# Patient Record
Sex: Male | Born: 1970 | Race: Black or African American | Hispanic: No | Marital: Married | Smoking: Never smoker
Health system: Southern US, Community
[De-identification: ages and names within clinical notes are randomized; demographics above are authoritative.]

## PROBLEM LIST (undated history)

## (undated) DIAGNOSIS — E119 Type 2 diabetes mellitus without complications: Secondary | ICD-10-CM

## (undated) DIAGNOSIS — I10 Essential (primary) hypertension: Secondary | ICD-10-CM

## (undated) DIAGNOSIS — F329 Major depressive disorder, single episode, unspecified: Secondary | ICD-10-CM

## (undated) DIAGNOSIS — M1990 Unspecified osteoarthritis, unspecified site: Secondary | ICD-10-CM

## (undated) DIAGNOSIS — A159 Respiratory tuberculosis unspecified: Secondary | ICD-10-CM

## (undated) DIAGNOSIS — F32A Depression, unspecified: Secondary | ICD-10-CM

## (undated) HISTORY — PX: HERNIA REPAIR: SHX51

## (undated) HISTORY — DX: Major depressive disorder, single episode, unspecified: F32.9

## (undated) HISTORY — DX: Depression, unspecified: F32.A

## (undated) HISTORY — DX: Unspecified osteoarthritis, unspecified site: M19.90

---

## 2013-05-30 ENCOUNTER — Emergency Department (HOSPITAL_COMMUNITY): Payer: Self-pay

## 2013-05-30 ENCOUNTER — Encounter (HOSPITAL_COMMUNITY): Payer: Self-pay | Admitting: *Deleted

## 2013-05-30 ENCOUNTER — Observation Stay (HOSPITAL_COMMUNITY)
Admission: EM | Admit: 2013-05-30 | Discharge: 2013-06-02 | Disposition: A | Discharge status: Home / Self Care | Payer: MEDICAID | Attending: Family Medicine | Admitting: Family Medicine

## 2013-05-30 DIAGNOSIS — M1990 Unspecified osteoarthritis, unspecified site: Secondary | ICD-10-CM | POA: Insufficient documentation

## 2013-05-30 DIAGNOSIS — E119 Type 2 diabetes mellitus without complications: Secondary | ICD-10-CM | POA: Diagnosis present

## 2013-05-30 DIAGNOSIS — R0789 Other chest pain: Principal | ICD-10-CM | POA: Insufficient documentation

## 2013-05-30 DIAGNOSIS — R0609 Other forms of dyspnea: Secondary | ICD-10-CM

## 2013-05-30 DIAGNOSIS — Z79899 Other long term (current) drug therapy: Secondary | ICD-10-CM | POA: Insufficient documentation

## 2013-05-30 DIAGNOSIS — F319 Bipolar disorder, unspecified: Secondary | ICD-10-CM | POA: Insufficient documentation

## 2013-05-30 DIAGNOSIS — R079 Chest pain, unspecified: Secondary | ICD-10-CM

## 2013-05-30 DIAGNOSIS — F3289 Other specified depressive episodes: Secondary | ICD-10-CM | POA: Insufficient documentation

## 2013-05-30 DIAGNOSIS — I10 Essential (primary) hypertension: Secondary | ICD-10-CM | POA: Insufficient documentation

## 2013-05-30 DIAGNOSIS — R0600 Dyspnea, unspecified: Secondary | ICD-10-CM

## 2013-05-30 DIAGNOSIS — F329 Major depressive disorder, single episode, unspecified: Secondary | ICD-10-CM | POA: Insufficient documentation

## 2013-05-30 HISTORY — DX: Respiratory tuberculosis unspecified: A15.9

## 2013-05-30 HISTORY — DX: Type 2 diabetes mellitus without complications: E11.9

## 2013-05-30 HISTORY — DX: Essential (primary) hypertension: I10

## 2013-05-30 LAB — COMPREHENSIVE METABOLIC PANEL
AST: 31 U/L (ref 0–37)
Alkaline Phosphatase: 102 U/L (ref 39–117)
BUN: 10 mg/dL (ref 6–23)
CO2: 24 mEq/L (ref 19–32)
Chloride: 102 mEq/L (ref 96–112)
Creatinine, Ser: 1.17 mg/dL (ref 0.50–1.35)
GFR calc non Af Amer: 74 mL/min — ABNORMAL LOW (ref >90)
Total Bilirubin: 0.4 mg/dL (ref 0.3–1.2)

## 2013-05-30 LAB — CBC WITH DIFFERENTIAL/PLATELET
Eosinophils Relative: 2 % (ref 0–5)
HCT: 34.4 % — ABNORMAL LOW (ref 39.0–52.0)
Hemoglobin: 10.9 g/dL — ABNORMAL LOW (ref 13.0–17.0)
Lymphocytes Relative: 23 % (ref 12–46)
Lymphs Abs: 2.1 10*3/uL (ref 0.7–4.0)
MCV: 57.9 fL — ABNORMAL LOW (ref 78.0–100.0)
Monocytes Absolute: 0.4 10*3/uL (ref 0.1–1.0)
RBC: 5.94 MIL/uL — ABNORMAL HIGH (ref 4.22–5.81)
WBC: 9.2 10*3/uL (ref 4.0–10.5)

## 2013-05-30 LAB — POCT I-STAT TROPONIN I
Troponin i, poc: 0.01 ng/mL (ref 0.00–0.08)
Troponin i, poc: 0.01 ng/mL (ref 0.00–0.08)

## 2013-05-30 MED ORDER — NITROGLYCERIN 0.4 MG SL SUBL
0.4000 mg | SUBLINGUAL_TABLET | SUBLINGUAL | Status: AC | PRN
  Administered 2013-05-30 (×3): 0.4 mg via SUBLINGUAL
  Filled 2013-05-30: qty 25

## 2013-05-30 MED ORDER — ACETAMINOPHEN 325 MG PO TABS
ORAL_TABLET | ORAL | Status: AC
  Administered 2013-05-30: 650 mg via ORAL
  Filled 2013-05-30: qty 2

## 2013-05-30 MED ORDER — ONDANSETRON HCL 4 MG/2ML IJ SOLN: 4.0000 mg | Freq: Four times a day (QID) | INTRAMUSCULAR | Status: DC | PRN

## 2013-05-30 MED ORDER — HEPARIN SODIUM (PORCINE) 5000 UNIT/ML IJ SOLN
5000.0000 [IU] | Freq: Three times a day (TID) | INTRAMUSCULAR | Status: DC
  Administered 2013-05-31 – 2013-06-02 (×8): 5000 [IU] via SUBCUTANEOUS
  Filled 2013-05-30 (×11): qty 1

## 2013-05-30 MED ORDER — DICLOFENAC SODIUM 75 MG PO TBEC
75.0000 mg | DELAYED_RELEASE_TABLET | Freq: Two times a day (BID) | ORAL | Status: DC | PRN
  Filled 2013-05-30: qty 1

## 2013-05-30 MED ORDER — INSULIN ASPART 100 UNIT/ML ~~LOC~~ SOLN
0.0000 [IU] | SUBCUTANEOUS | Status: DC
  Administered 2013-05-31: 3 [IU] via SUBCUTANEOUS
  Administered 2013-05-31 (×2): 2 [IU] via SUBCUTANEOUS

## 2013-05-30 MED ORDER — ASPIRIN 81 MG PO CHEW
324.0000 mg | CHEWABLE_TABLET | Freq: Once | ORAL | Status: AC
  Administered 2013-05-30: 324 mg via ORAL
  Filled 2013-05-30: qty 4

## 2013-05-30 MED ORDER — VITAMIN D3 25 MCG (1000 UNIT) PO TABS
1000.0000 [IU] | ORAL_TABLET | Freq: Two times a day (BID) | ORAL | Status: DC
  Administered 2013-05-31 – 2013-06-02 (×6): 1000 [IU] via ORAL
  Filled 2013-05-30 (×7): qty 1

## 2013-05-30 MED ORDER — ACETAMINOPHEN 325 MG PO TABS
650.0000 mg | ORAL_TABLET | ORAL | Status: DC | PRN
  Administered 2013-06-01: 650 mg via ORAL
  Filled 2013-05-30: qty 2

## 2013-05-30 MED ORDER — ACETAMINOPHEN 325 MG PO TABS: 650.0000 mg | ORAL_TABLET | Freq: Once | ORAL | Status: AC

## 2013-05-30 MED ORDER — RISPERIDONE 2 MG PO TABS
4.0000 mg | ORAL_TABLET | Freq: Every day | ORAL | Status: DC
  Administered 2013-05-31 – 2013-06-01 (×3): 4 mg via ORAL
  Filled 2013-05-30 (×4): qty 2

## 2013-05-30 MED ORDER — LISINOPRIL 20 MG PO TABS
20.0000 mg | ORAL_TABLET | Freq: Every day | ORAL | Status: DC
  Administered 2013-05-31 – 2013-06-02 (×3): 20 mg via ORAL
  Filled 2013-05-30 (×3): qty 1

## 2013-05-30 MED ORDER — LITHIUM CARBONATE 300 MG PO CAPS
300.0000 mg | ORAL_CAPSULE | Freq: Two times a day (BID) | ORAL | Status: DC
  Administered 2013-05-31 – 2013-06-02 (×5): 300 mg via ORAL
  Filled 2013-05-30 (×8): qty 1

## 2013-05-30 MED ORDER — TOPIRAMATE 25 MG PO TABS
50.0000 mg | ORAL_TABLET | Freq: Two times a day (BID) | ORAL | Status: DC
  Administered 2013-05-31 – 2013-06-02 (×6): 50 mg via ORAL
  Filled 2013-05-30 (×7): qty 2

## 2013-05-30 MED ORDER — MECLIZINE HCL 25 MG PO TABS
25.0000 mg | ORAL_TABLET | Freq: Two times a day (BID) | ORAL | Status: DC | PRN
  Filled 2013-05-30: qty 1

## 2013-05-30 NOTE — ED Notes (Signed)
Pt stated started having chest pain around 1730; has remained constant; feels light headed and dizzy; tightness

## 2013-05-30 NOTE — ED Notes (Signed)
Bed: WA21 Expected date:  Expected time:  Means of arrival:  Comments: Hall g

## 2013-05-30 NOTE — H&P (Signed)
[facility] History and Physical  Steve Pierce UJW:[id number] DOB: 1971/03/22 DOA: 05/30/2013  Referring physician: ED PCP: No primary provider on file.   Chief Complaint: Chest pain  HPI: Steve Pierce is a 42 y.o. male who presents with sudden onset L sided chest pain that occurred when coming home today.  Crushing L sided chest pain was associated with SOB, no N/V, no radiation.  No cardiac history but does have HTN and poorly controlled DM with elevated A1C (he thinks it was 11 last time).  Similar symptoms in [place] many years ago resulted in stress test.  No h/o PE, DVT, nor prolonged immobilization, has had BLE swelling for which he takes lasix but no calf tenderness.  HEART score = 4: admitting for chest pain rule out.  Review of Systems: 12 systems reviewed and otherwise negative.  Past Medical History  Diagnosis Date  . Hypertension   . Diabetes mellitus without complication    Past Surgical History  Procedure Laterality Date  . Hernia repair     Social History:  reports that he has never smoked. He does not have any smokeless tobacco history on file. He reports that he does not drink alcohol. His drug history is not on file.   No Known Allergies  Family History  Problem Relation Age of Onset  . CAD Neg Hx     Prior to Admission medications   Medication Sig Start Date End Date Taking? Authorizing Provider  cholecalciferol (VITAMIN D) 1000 UNITS tablet Take 1,000 Units by mouth 2 (two) times daily.   Yes Historical Provider, MD  diclofenac (VOLTAREN) 75 MG EC tablet Take 75 mg by mouth 2 (two) times daily as needed (for pain).   Yes Historical Provider, MD  furosemide (LASIX) 40 MG tablet Take 40 mg by mouth daily.   Yes Historical Provider, MD  glipiZIDE (GLUCOTROL) 5 MG tablet Take 5 mg by mouth every morning.   Yes Historical Provider, MD  lisinopril (PRINIVIL,ZESTRIL) 20 MG tablet Take 20 mg by mouth daily.   Yes Historical Provider, MD  lithium  carbonate 300 MG capsule Take 300 mg by mouth 2 (two) times daily with a meal.   Yes Historical Provider, MD  meclizine (ANTIVERT) 25 MG tablet Take 25 mg by mouth 2 (two) times daily as needed for dizziness.   Yes Historical Provider, MD  risperidone (RISPERDAL) 4 MG tablet Take 4 mg by mouth at bedtime.   Yes Historical Provider, MD  sitaGLIPtan-metformin (JANUMET) 50-500 MG per tablet Take 1 tablet by mouth every morning.   Yes Historical Provider, MD  topiramate (TOPAMAX) 25 MG tablet Take 50 mg by mouth 2 (two) times daily.   Yes Historical Provider, MD   Physical Exam: Filed Vitals:   05/30/13 2019  BP: 131/80  Pulse: 104  Temp: 99.1 F (37.3 C)    General:  NAD, resting comfortably in bed Eyes: PEERLA EOMI ENT: mucous membranes moist Neck: supple w/o JVD Cardiovascular: RRR w/o MRG Respiratory: CTA B Abdomen: soft, nt, nd, bs+ Skin: no rash nor lesion Musculoskeletal: MAE, full ROM all 4 extremities Psychiatric: normal tone and affect Neurologic: AAOx3, grossly non-focal  Labs on Admission:  Basic Metabolic Panel:  Recent Labs Lab 05/30/13 2029  NA 136  K 3.8  CL 102  CO2 24  GLUCOSE 133*  BUN 10  CREATININE 1.17  CALCIUM 9.3   Liver Function Tests:  Recent Labs Lab 05/30/13 2029  AST 31  ALT 45  ALKPHOS 102  BILITOT 0.4  PROT 7.3  ALBUMIN 3.8   No results found for this basename: LIPASE, AMYLASE,  in the last 168 hours No results found for this basename: AMMONIA,  in the last 168 hours CBC:  Recent Labs Lab 05/30/13 2029  WBC 9.2  NEUTROABS 6.5  HGB 10.9*  HCT 34.4*  MCV 57.9*  PLT 292   Cardiac Enzymes: No results found for this basename: CKTOTAL, CKMB, CKMBINDEX, TROPONINI,  in the last 168 hours  BNP (last 3 results)  Recent Labs  05/30/13 2029  PROBNP 15.6   CBG: No results found for this basename: GLUCAP,  in the last 168 hours  Radiological Exams on Admission: Dg Chest 2 View  05/30/2013   *RADIOLOGY REPORT*  Clinical  Data: Chest pain and shortness of breath.  CHEST - 2 VIEW  Comparison: None.  Findings: Heart size is normal.  There is slight vascular prominence but the patient has taken a very shallow inspiration. No consolidative infiltrates or effusions.  No osseous abnormalities.  IMPRESSION: No acute abnormality.   Original Report Authenticated By: Francene Boyers, Pierce.D.    EKG: Independently reviewed.  Assessment/Plan Principal Problem:   Chest pain Active Problems:   DM2 (diabetes mellitus, type 2)   HTN (hypertension)   1. Chest pain - given the very poorly controlled DM2 which is a disease risk equivilent for CAD, and the fact that he is less than 1 year of age from getting a point for age, do feel that Despite a formal heart score of 3, his heart score could be argued to be almost as high as 5.  Do feel that he deserves a formal cardiology consultation and likely will need stress test for rule out.  Have also ordered 2D echo, but despite the mild tachycardia of 104 and temperature of 99.1 given no risk factors, no history of prolonged immobility, no calf pain, and no unilateral swelling, feel that PE is less likely. 2. DM2 - holding home meds and putting him on med dose SSI q4h while he is NPO. 3. HTN - continue home meds other than lasix which is being held (dont want him getting dehydrated while NPO).    Code Status: Full Code (must indicate code status--if unknown or must be presumed, indicate so) Family Communication: Spoke with family at bedside (indicate person spoken with, if applicable, with phone number if by telephone) Disposition Plan: Admit to obs (indicate anticipated LOS)  Time spent: 50 min  Steve Pierce, Steve Pierce. [facility] Pager [phone]  If 7PM-7AM, please contact night-coverage [email or web address] Password [facility] [place] 05/30/2013, 10:44 PM

## 2013-05-30 NOTE — ED Provider Notes (Signed)
TIME SEEN: 8:44 PM   CHIEF COMPLAINT: Chest pain  HPI: Patient is a 42 year old male with history of hypertension, diabetes presents emergency department with sudden onset left-sided chest tightness without radiation at approximately 5:30 tonight. He states his symptoms occurred at rest and cause him to feel short of breath, lightheaded and diaphoretic. No nausea or vomiting. He's had similar symptoms many years ago which resulted in a stress test and [place] which he reports was negative. No history of MI or cardiac catheterization. No prior history of PE or DVT. He states he has had bilateral lower extremity swelling but no calf tenderness. No recent prolonged immobilization, fracture, surgery, trauma. He denies a history of hyperlipidemia or tobacco use.  ROS: See HPI Constitutional: no fever  Eyes: no drainage  ENT: no runny nose   Cardiovascular:  chest pain  Resp: SOB  GI: no vomiting GU: no dysuria Integumentary: no rash  Allergy: no hives  Musculoskeletal: leg swelling  Neurological: no slurred speech ROS otherwise negative  PAST MEDICAL HISTORY/PAST SURGICAL HISTORY:  Past Medical History  Diagnosis Date  . Hypertension   . Diabetes mellitus without complication     MEDICATIONS:  Prior to Admission medications   Medication Sig Start Date End Date Taking? Authorizing Provider  cholecalciferol (VITAMIN D) 1000 UNITS tablet Take 1,000 Units by mouth 2 (two) times daily.   Yes Historical Provider, MD  diclofenac (VOLTAREN) 75 MG EC tablet Take 75 mg by mouth 2 (two) times daily as needed (for pain).   Yes Historical Provider, MD  furosemide (LASIX) 40 MG tablet Take 40 mg by mouth daily.   Yes Historical Provider, MD  glipiZIDE (GLUCOTROL) 5 MG tablet Take 5 mg by mouth every morning.   Yes Historical Provider, MD  lisinopril (PRINIVIL,ZESTRIL) 20 MG tablet Take 20 mg by mouth daily.   Yes Historical Provider, MD  lithium carbonate 300 MG capsule Take 300 mg by mouth 2  (two) times daily with a meal.   Yes Historical Provider, MD  meclizine (ANTIVERT) 25 MG tablet Take 25 mg by mouth 2 (two) times daily as needed for dizziness.   Yes Historical Provider, MD  risperidone (RISPERDAL) 4 MG tablet Take 4 mg by mouth at bedtime.   Yes Historical Provider, MD  sitaGLIPtan-metformin (JANUMET) 50-500 MG per tablet Take 1 tablet by mouth every morning.   Yes Historical Provider, MD  topiramate (TOPAMAX) 25 MG tablet Take 50 mg by mouth 2 (two) times daily.   Yes Historical Provider, MD    ALLERGIES:  No Known Allergies  SOCIAL HISTORY:  History  Substance Use Topics  . Smoking status: Never Smoker   . Smokeless tobacco: Not on file  . Alcohol Use: No    FAMILY HISTORY: Grandfather with a history of coronary artery disease  EXAM: BP 131/80  Pulse 104  Temp(Src) 99.1 F (37.3 C)  SpO2 97% CONSTITUTIONAL: Alert and oriented and responds appropriately to questions. Well-appearing; well-nourished HEAD: Normocephalic EYES: Conjunctivae clear, PERRL ENT: normal nose; no rhinorrhea; moist mucous membranes; pharynx without lesions noted NECK: Supple, no meningismus, no LAD  CARD: Tachycardic; S1 and S2 appreciated; no murmurs, no clicks, no rubs, no gallops RESP: Normal chest excursion without splinting or tachypnea; breath sounds clear and equal bilaterally; no wheezes, no rhonchi, no rales,  ABD/GI: Normal bowel sounds; non-distended; soft, non-tender, no rebound, no guarding BACK:  The back appears normal and is non-tender to palpation, there is no CVA tenderness EXT: Normal ROM in all joints; non-tender  to palpation; no edema; normal capillary refill; no cyanosis    SKIN: Normal color for age and race; warm NEURO: Moves all extremities equally PSYCH: The patient's mood and manner are appropriate. Grooming and personal hygiene are appropriate.  MEDICAL DECISION MAKING: Patient was concerning story for cardiac chest pain. We'll obtain labs, EKG, chest  x-ray. Anticipate admission to the hospital for chest pain rule out.  ED PROGRESS:    Date: 05/30/2013 20:26  Rate: 102  Rhythm: Sinus tachycardia  QRS Axis: normal  Intervals: normal  ST/T Wave abnormalities: normal  Conduction Disutrbances: none  Narrative Interpretation: unremarkable; no ischemic changes, no old for comparison   9:32 PM  CXR clear.  First troponin negative. Will discuss with internal medicine for admission. PCP is Dartha Lodge with [place].  10:25 PM  Spoke with hospitalist for admission for chest pain rule out.  Layla Maw Murphy Duzan, DO 05/30/13 2225

## 2013-05-31 ENCOUNTER — Encounter (HOSPITAL_COMMUNITY): Payer: Self-pay

## 2013-05-31 LAB — GLUCOSE, CAPILLARY
Glucose-Capillary: 136 mg/dL — ABNORMAL HIGH (ref 70–99)
Glucose-Capillary: 166 mg/dL — ABNORMAL HIGH (ref 70–99)
Glucose-Capillary: 150 mg/dL — ABNORMAL HIGH (ref 70–99)

## 2013-05-31 LAB — HEMOGLOBIN A1C
Hgb A1c MFr Bld: 7.3 % — ABNORMAL HIGH (ref <5.7)
Mean Plasma Glucose: 163 mg/dL — ABNORMAL HIGH (ref <117)

## 2013-05-31 MED ORDER — INSULIN ASPART 100 UNIT/ML ~~LOC~~ SOLN
3.0000 [IU] | Freq: Three times a day (TID) | SUBCUTANEOUS | Status: DC
  Administered 2013-05-31 – 2013-06-01 (×4): 3 [IU] via SUBCUTANEOUS

## 2013-05-31 MED ORDER — SODIUM CHLORIDE 0.9 % IJ SOLN
3.0000 mL | Freq: Two times a day (BID) | INTRAMUSCULAR | Status: DC
  Administered 2013-05-31 – 2013-06-02 (×4): 3 mL via INTRAVENOUS

## 2013-05-31 MED ORDER — INSULIN ASPART 100 UNIT/ML ~~LOC~~ SOLN
0.0000 [IU] | Freq: Three times a day (TID) | SUBCUTANEOUS | Status: DC
  Administered 2013-05-31: 1 [IU] via SUBCUTANEOUS
  Administered 2013-05-31 – 2013-06-01 (×2): 2 [IU] via SUBCUTANEOUS
  Administered 2013-06-01: 3 [IU] via SUBCUTANEOUS
  Administered 2013-06-02 (×2): 2 [IU] via SUBCUTANEOUS

## 2013-05-31 NOTE — Consult Note (Signed)
Reason for Consult chest pain Referring Physician: [facility]  Steve Pierce is an 42 y.o. male.  HPI: Patient is 42 year old male with past medical history significant for hypertension, hypercholesteremia, morbid obesity, depression, degenerative joint disease, was admitted because of precordial chest pain described as tightness associated with lightheadedness dizziness and diaphoresis grade 9/10 now after walking. Patient also gives history of exertional dyspnea by walking uphill. Denies any syncopal episode. Denies history of PND orthopnea his history of leg swelling. States had stress test approximately 15 years ago in [place] which was okay. Denies any recent cardiac workup. Patient received 3 sublingual nitroglycerin with relief of chest pain. Patient states also chest pain increases with deep breathing and sitting forward. Denies any fever chills or flulike symptoms.  Past Medical History  Diagnosis Date  . Hypertension   . Diabetes mellitus without complication     Past Surgical History  Procedure Laterality Date  . Hernia repair      Family History  Problem Relation Age of Onset  . CAD Neg Hx     Social History:  reports that he has never smoked. He does not have any smokeless tobacco history on file. He reports that he does not drink alcohol. His drug history is not on file.  Allergies: No Known Allergies  Medications: I have reviewed the patient's current medications.  Results for orders placed during the hospital encounter of 05/30/13 (from the past 48 hour(s))  CBC WITH DIFFERENTIAL     Status: Abnormal   Collection Time    05/30/13  8:29 PM      Result Value Range   WBC 9.2  4.0 - 10.5 K/uL   RBC 5.94 (*) 4.22 - 5.81 MIL/uL   Hemoglobin 10.9 (*) 13.0 - 17.0 g/dL   HCT 11.9 (*) 14.7 - 82.9 %   MCV 57.9 (*) 78.0 - 100.0 fL   MCH 18.4 (*) 26.0 - 34.0 pg   MCHC 31.7  30.0 - 36.0 g/dL   RDW 56.2 (*) 13.0 - 86.5 %   Platelets 292  150 - 400 K/uL   Neutrophils Relative % 71  43 - 77 %   Neutro Abs 6.5  1.7 - 7.7 K/uL   Lymphocytes Relative 23  12 - 46 %   Lymphs Abs 2.1  0.7 - 4.0 K/uL   Monocytes Relative 5  3 - 12 %   Monocytes Absolute 0.4  0.1 - 1.0 K/uL   Eosinophils Relative 2  0 - 5 %   Eosinophils Absolute 0.2  0.0 - 0.7 K/uL   Basophils Relative 0  0 - 1 %   Basophils Absolute 0.0  0.0 - 0.1 K/uL   Smear Review MORPHOLOGY UNREMARKABLE    COMPREHENSIVE METABOLIC PANEL     Status: Abnormal   Collection Time    05/30/13  8:29 PM      Result Value Range   Sodium 136  135 - 145 mEq/L   Potassium 3.8  3.5 - 5.1 mEq/L   Chloride 102  96 - 112 mEq/L   CO2 24  19 - 32 mEq/L   Glucose, Bld 133 (*) 70 - 99 mg/dL   BUN 10  6 - 23 mg/dL   Creatinine, Ser 7.84  0.50 - 1.35 mg/dL   Calcium 9.3  8.4 - 69.6 mg/dL   Total Protein 7.3  6.0 - 8.3 g/dL   Albumin 3.8  3.5 - 5.2 g/dL   AST 31  0 - 37 U/L   ALT  45  0 - 53 U/L   Alkaline Phosphatase 102  39 - 117 U/L   Total Bilirubin 0.4  0.3 - 1.2 mg/dL   GFR calc non Af Amer 74 (*) >90 mL/min   GFR calc Af Amer 86 (*) >90 mL/min   Comment: (NOTE)     The eGFR has been calculated using the CKD EPI equation.     This calculation has not been validated in all clinical situations.     eGFR's persistently <90 mL/min signify possible Chronic Kidney     Disease.  PRO B NATRIURETIC PEPTIDE     Status: None   Collection Time    05/30/13  8:29 PM      Result Value Range   Pro B Natriuretic peptide (BNP) 15.6  0 - 125 pg/mL  HEMOGLOBIN A1C     Status: Abnormal   Collection Time    05/30/13  8:29 PM      Result Value Range   Hemoglobin A1C 7.3 (*) <5.7 %   Comment: (NOTE)                                                                               According to the ADA Clinical Practice Recommendations for 2011, when     HbA1c is used as a screening test:      >=6.5%   Diagnostic of Diabetes Mellitus               (if abnormal result is confirmed)     5.7-6.4%   Increased risk of  developing Diabetes Mellitus     References:Diagnosis and Classification of Diabetes Mellitus,Diabetes     Care,2011,34(Suppl 1):S62-S69 and Standards of Medical Care in             Diabetes - 2011,Diabetes Care,2011,34 (Suppl 1):S11-S61.   Mean Plasma Glucose 163 (*) <117 mg/dL   Comment: Performed at [facility]  POCT I-STAT TROPONIN I     Status: None   Collection Time    05/30/13  8:40 PM      Result Value Range   Troponin i, poc 0.01  0.00 - 0.08 ng/mL   Comment 3            Comment: Due to the release kinetics of cTnI,     a negative result within the first hours     of the onset of symptoms does not rule out     myocardial infarction with certainty.     If myocardial infarction is still suspected,     repeat the test at appropriate intervals.  POCT I-STAT TROPONIN I     Status: None   Collection Time    05/30/13 11:05 PM      Result Value Range   Troponin i, poc 0.01  0.00 - 0.08 ng/mL   Comment 3            Comment: Due to the release kinetics of cTnI,     a negative result within the first hours     of the onset of symptoms does not rule out     myocardial infarction with certainty.     If myocardial infarction is still  suspected,     repeat the test at appropriate intervals.  GLUCOSE, CAPILLARY     Status: Abnormal   Collection Time    05/30/13 11:35 PM      Result Value Range   Glucose-Capillary 146 (*) 70 - 99 mg/dL  GLUCOSE, CAPILLARY     Status: Abnormal   Collection Time    05/31/13  4:11 AM      Result Value Range   Glucose-Capillary 136 (*) 70 - 99 mg/dL  TROPONIN I     Status: None   Collection Time    05/31/13  6:00 AM      Result Value Range   Troponin I <0.30  <0.30 ng/mL   Comment:            Due to the release kinetics of cTnI,     a negative result within the first hours     of the onset of symptoms does not rule out     myocardial infarction with certainty.     If myocardial infarction is still suspected,     repeat the test at  appropriate intervals.  GLUCOSE, CAPILLARY     Status: Abnormal   Collection Time    05/31/13  7:49 AM      Result Value Range   Glucose-Capillary 154 (*) 70 - 99 mg/dL   Comment 1 Notify RN     Comment 2 Documented in Chart      Dg Chest 2 View  05/30/2013   *RADIOLOGY REPORT*  Clinical Data: Chest pain and shortness of breath.  CHEST - 2 VIEW  Comparison: None.  Findings: Heart size is normal.  There is slight vascular prominence but the patient has taken a very shallow inspiration. No consolidative infiltrates or effusions.  No osseous abnormalities.  IMPRESSION: No acute abnormality.   Original Report Authenticated By: Francene Boyers, M.D.    Review of Systems  Constitutional: Negative for fever, chills and weight loss.  Eyes: Negative for blurred vision and double vision.  Respiratory: Negative for cough, hemoptysis and sputum production.   Cardiovascular: Positive for chest pain, palpitations and leg swelling. Negative for orthopnea, claudication and PND.  Gastrointestinal: Negative for nausea, vomiting and abdominal pain.  Neurological: Positive for dizziness.   Blood pressure 119/69, pulse 83, temperature 98.1 F (36.7 C), temperature source Oral, resp. rate 14, height 5\' 11"  (1.803 m), weight 135.8 kg (299 lb 6.2 oz), SpO2 96.00%. Physical Exam  Constitutional: He is oriented to person, place, and time. No distress.  HENT:  Head: Normocephalic and atraumatic.  Eyes: Conjunctivae are normal. Pupils are equal, round, and reactive to light. Left eye exhibits no discharge.  Neck: Normal range of motion. Neck supple. No JVD present. No tracheal deviation present. No thyromegaly present.  Cardiovascular: Normal rate, regular rhythm and intact distal pulses.  Exam reveals no friction rub.   No murmur heard. Respiratory: Effort normal and breath sounds normal. No respiratory distress. He has no wheezes. He has no rales.  GI: Soft. Bowel sounds are normal. He exhibits no distension.  There is no tenderness. There is no rebound.  Musculoskeletal: He exhibits no tenderness.  Neurological: He is alert and oriented to person, place, and time.  Skin: He is not diaphoretic.    Assessment/Plan: Status post atypical chest pain with some features worrisome for angina MI ruled out Hypertension Diabetes mellitus Morbid obesity Depression Degenerative joint disease Plan Agree with present management Will schedule for nuclear stress test tomorrow Check lipid panel/hemoglobin  A1c Discussed with patient regarding lifestyle modification diet exercise  Billie Intriago N 05/31/2013, 11:49 AM

## 2013-05-31 NOTE — Care Management Note (Addendum)
    Page 1 of 1   06/02/2013     12:13:50 PM   CARE MANAGEMENT NOTE 06/02/2013  Patient:  [facility], LIPTON   Account Number:  [id number]  Date Initiated:  05/31/2013  Documentation initiated by:  Lanier Clam  Subjective/Objective Assessment:   ADMITTED W/CHEST PAIN.HX:HTN,DM.     Action/Plan:   FROM HOME.   Anticipated DC Date:  06/02/2013   Anticipated DC Plan:  HOME/SELF CARE      DC Planning Services  CM consult      Choice offered to / List presented to:             Status of service:  Completed, signed off Medicare Important Message given?   (If response is "NO", the following Medicare IM given date fields will be blank) Date Medicare IM given:   Date Additional Medicare IM given:    Discharge Disposition:  HOME/SELF CARE  Per UR Regulation:  Reviewed for med. necessity/level of care/duration of stay  If discussed at Long Length of Stay Meetings, dates discussed:    Comments:  06/02/13 Daxon Kyne RN,BSN NCM [phone] D/C HOME NO NEEDS OR ORDERS.  06/01/13 Consuela Widener RN,BSN NCM [phone] STRESS TEST TODAY.D/C PLAN HOME.  05/31/13 Jenya Putz RN,BSN NCM [phone]

## 2013-05-31 NOTE — Progress Notes (Signed)
  Echocardiogram 2D Echocardiogram has been performed.  Steve Pierce 05/31/2013, 11:29 AM

## 2013-05-31 NOTE — Progress Notes (Signed)
8:13 AM I agree with HPI/GPe and A/P per Dr. Julian Reil  42 y/o AAM, no known heart disease but reported neg Stress test in the past presented to [facility] 8/20 with CP after walking less than a mile.  This is unaccustomed exertion for him and he is usually sedentary.  Quality of pain was gripping and central chest, with diaphoresis, no n/v/f/chills.  No radiation.  Sitting up seemed to make it worse    Had LE swelling about 8/16 which is usual for him and has been on lasix in the past but has never formally been told he has CHF Was given nitro in ED which releived pain Has had this pain before but thought it was gas  HEENT Morbid obesity, Body mass index is 41.77 kg/(m^2). CHEST cta b CARDIAC s1 s2 no m/r/g ABDOMEN soft NT/ND  Patient Active Problem List   Diagnosis Date Noted  . DM2 (diabetes mellitus, type 2) 05/30/2013  . Chest pain 05/30/2013  . HTN (hypertension) 05/30/2013   A/p?Cardigenic CP-spoke with Dr. Sharyn Lull, Cardiology who agrees to see him in consult.  Will grad diet and monitor CBG's qid ac Hold Janumet in hospital, Glipizide., Lasix for now Get CMET and CBC am and ECHO pending   Pleas Koch, MD [facility] [phone]

## 2013-06-01 ENCOUNTER — Other Ambulatory Visit: Payer: Self-pay

## 2013-06-01 ENCOUNTER — Ambulatory Visit (HOSPITAL_COMMUNITY)
Admit: 2013-06-01 | Discharge: 2013-06-01 | Disposition: A | Discharge status: Home / Self Care | Payer: Self-pay | Attending: Cardiology | Admitting: Cardiology

## 2013-06-01 DIAGNOSIS — I10 Essential (primary) hypertension: Secondary | ICD-10-CM | POA: Insufficient documentation

## 2013-06-01 DIAGNOSIS — R079 Chest pain, unspecified: Secondary | ICD-10-CM | POA: Insufficient documentation

## 2013-06-01 DIAGNOSIS — E119 Type 2 diabetes mellitus without complications: Secondary | ICD-10-CM | POA: Insufficient documentation

## 2013-06-01 LAB — GLUCOSE, CAPILLARY
Glucose-Capillary: 159 mg/dL — ABNORMAL HIGH (ref 70–99)
Glucose-Capillary: 201 mg/dL — ABNORMAL HIGH (ref 70–99)

## 2013-06-01 LAB — COMPREHENSIVE METABOLIC PANEL
Albumin: 3.5 g/dL (ref 3.5–5.2)
BUN: 13 mg/dL (ref 6–23)
Chloride: 104 mEq/L (ref 96–112)
Creatinine, Ser: 1.11 mg/dL (ref 0.50–1.35)
Total Bilirubin: 0.4 mg/dL (ref 0.3–1.2)
Total Protein: 6.8 g/dL (ref 6.0–8.3)

## 2013-06-01 LAB — CBC
MCV: 58.4 fL — ABNORMAL LOW (ref 78.0–100.0)
Platelets: 255 10*3/uL (ref 150–400)
RBC: 5.77 MIL/uL (ref 4.22–5.81)
WBC: 6.5 10*3/uL (ref 4.0–10.5)

## 2013-06-01 MED ORDER — REGADENOSON 0.4 MG/5ML IV SOLN
INTRAVENOUS | Status: AC
  Filled 2013-06-01: qty 5

## 2013-06-01 MED ORDER — REGADENOSON 0.4 MG/5ML IV SOLN
0.4000 mg | Freq: Once | INTRAVENOUS | Status: AC
  Administered 2013-06-01: 0.4 mg via INTRAVENOUS

## 2013-06-01 MED ORDER — TECHNETIUM TC 99M SESTAMIBI GENERIC - CARDIOLITE
30.0000 | Freq: Once | INTRAVENOUS | Status: AC | PRN
  Administered 2013-06-01: 30 via INTRAVENOUS

## 2013-06-01 NOTE — Progress Notes (Signed)
Subjective:  Patient denies any further chest pains or shortness of breath. Schedule for nuclear stress test today two protocol because of morbid obesity .  Objective:  Vital Signs in the last 24 hours: Temp:  [98 F (36.7 C)-98.5 F (36.9 C)] 98.5 F (36.9 C) (08/21 0514) Pulse Rate:  [80-93] 93 (08/21 0514) Resp:  [14-20] 18 (08/21 0514) BP: (113-128)/(63-78) 120/78 mmHg (08/21 0514) SpO2:  [95 %-100 %] 95 % (08/21 0514)  Intake/Output from previous day: 08/20 0701 - 08/21 0700 In: 1443 [P.O.:1440; I.V.:3] Out: -  Intake/Output from this shift: Total I/O In: 3 [I.V.:3] Out: -   Physical Exam: Neck: no adenopathy, no carotid bruit, no JVD and supple, symmetrical, trachea midline Lungs: clear to auscultation bilaterally Heart: regular rate and rhythm, S1, S2 normal, no murmur, click, rub or gallop Abdomen: soft, non-tender; bowel sounds normal; no masses,  no organomegaly Extremities: extremities normal, atraumatic, no cyanosis or edema  Lab Results:  Recent Labs  05/30/13 2029 06/01/13 0501  WBC 9.2 6.5  HGB 10.9* 10.7*  PLT 292 255    Recent Labs  05/30/13 2029 06/01/13 0501  NA 136 138  K 3.8 4.4  CL 102 104  CO2 24 26  GLUCOSE 133* 160*  BUN 10 13  CREATININE 1.17 1.11    Recent Labs  05/31/13 0600  TROPONINI <0.30   Hepatic Function Panel  Recent Labs  06/01/13 0501  PROT 6.8  ALBUMIN 3.5  AST 21  ALT 35  ALKPHOS 89  BILITOT 0.4    Recent Labs  05/31/13 0600  CHOL 149   No results found for this basename: PROTIME,  in the last 72 hours  Imaging: Imaging results have been reviewed and Dg Chest 2 View  05/30/2013   *RADIOLOGY REPORT*  Clinical Data: Chest pain and shortness of breath.  CHEST - 2 VIEW  Comparison: None.  Findings: Heart size is normal.  There is slight vascular prominence but the patient has taken a very shallow inspiration. No consolidative infiltrates or effusions.  No osseous abnormalities.  IMPRESSION: No acute  abnormality.   Original Report Authenticated By: Francene Boyers, M.D.    Cardiac Studies:  Assessment/Plan:  Status post atypical chest pain with some features worrisome for angina MI ruled out  Hypertension  Diabetes mellitus  Morbid obesity  Depression  Degenerative joint disease Plan Continue present management Scheduled for nuclear stress test as above  LOS: 2 days    Solae Norling N 06/01/2013, 8:05 AM

## 2013-06-01 NOTE — Progress Notes (Signed)
Steve Pierce ZOX:[id number] DOB: 12-02-1970 DOA: 05/30/2013 PCP: Dartha Lodge, FNP  Brief narrative: 42 y/o AAM admitted 05/30/13 with Sudden onset CP[ heart score of 4], crushing nature, known h/o htn,poorly controlled DM  Past medical history-As per Problem list  Consultants:  Cardiology Dr. Sharyn Lull 8/20  Procedures:  Stress test 8/21 and 8/22  Antibiotics:   none   Subjective  Doing well.  Walking the halls.  No further CP.  deneis n/v.sob   Objective    Interim History: nad  Telemetry:  nsr  Objective: Filed Vitals:   05/31/13 1335 05/31/13 1802 05/31/13 2033 06/01/13 0514  BP: 113/63 119/69 128/64 120/78  Pulse: 80 88 86 93  Temp: 98 F (36.7 C) 98 F (36.7 C) 98.5 F (36.9 C) 98.5 F (36.9 C)  TempSrc: Oral Oral Oral Oral  Resp: 20 20 18 18   Height:      Weight:      SpO2: 98% 99% 100% 95%    Intake/Output Summary (Last 24 hours) at 06/01/13 1352 Last data filed at 06/01/13 1200  Gross per 24 hour  Intake   1206 ml  Output      0 ml  Net   1206 ml    Exam:  General: eomi, ncat Cardiovascular: s1 s2 no m/r/g Respiratory: clear Abdomen: soft, nt nd Skin no le edema  Data Reviewed: Basic Metabolic Panel:  Recent Labs Lab 05/30/13 2029 06/01/13 0501  NA 136 138  K 3.8 4.4  CL 102 104  CO2 24 26  GLUCOSE 133* 160*  BUN 10 13  CREATININE 1.17 1.11  CALCIUM 9.3 9.3   Liver Function Tests:  Recent Labs Lab 05/30/13 2029 06/01/13 0501  AST 31 21  ALT 45 35  ALKPHOS 102 89  BILITOT 0.4 0.4  PROT 7.3 6.8  ALBUMIN 3.8 3.5   No results found for this basename: LIPASE, AMYLASE,  in the last 168 hours No results found for this basename: AMMONIA,  in the last 168 hours CBC:  Recent Labs Lab 05/30/13 2029 06/01/13 0501  WBC 9.2 6.5  NEUTROABS 6.5  --   HGB 10.9* 10.7*  HCT 34.4* 33.7*  MCV 57.9* 58.4*  PLT 292 255   Cardiac Enzymes:  Recent Labs Lab 05/31/13 0600  TROPONINI <0.30   BNP: No components found  with this basename: POCBNP,  CBG:  Recent Labs Lab 05/31/13 1155 05/31/13 1640 05/31/13 2105 06/01/13 0730 06/01/13 1217  GLUCAP 166* 150* 171* 159* 201*    No results found for this or any previous visit (from the past 240 hour(s)).   Studies:              All Imaging reviewed and is as per above notation   Scheduled Meds: . cholecalciferol  1,000 Units Oral BID  . heparin  5,000 Units Subcutaneous Q8H  . insulin aspart  0-9 Units Subcutaneous TID WC  . insulin aspart  3 Units Subcutaneous TID WC  . lisinopril  20 mg Oral Daily  . lithium carbonate  300 mg Oral BID WC  . risperidone  4 mg Oral QHS  . sodium chloride  3 mL Intravenous Q12H  . topiramate  50 mg Oral BID   Continuous Infusions:    Assessment/Plan: 1. ? Cardiogenic CP-2 Day stress test pending.  Await further input from Cardiology-if no further issues d/c in am 2. Htn-continue Lisinopril 20 daily 3. DM-Continue sensitive SSI and mealtime coverage-blood sugars 160-201 4. Bipolar-continue Lithium 300 bid, Risperidone 4 qhs  Code Status: full Family Communication: none at bedside Disposition Plan: obs   Pleas Koch, MD  [facility] Pager [phone] 06/01/2013, 1:52 PM    LOS: 2 days

## 2013-06-01 NOTE — Progress Notes (Signed)
Called Nuclear med to find out time & if pt was on schedule for Lexiscan, due to his wt, I was told he would be a two day tx; they would try to do the stress test today which he has been NPO since MN for today, & do the resting phase tomorrow on the 22nd, but if Dr is unable to come today due to him being in the cath lab(Dr [facility]), they may have to reverse the tx plan & do the resting phase of the test today & the stress test on the 22nd. [facility] notified & spoke with Maisie Fus, & time set for the pt's pick up at 9am this am to have the pt at [facility] Lab by 9:30am. The pt was informed of plans & pick up time.

## 2013-06-02 ENCOUNTER — Encounter (HOSPITAL_COMMUNITY)
Admit: 2013-06-02 | Discharge: 2013-06-02 | Disposition: A | Discharge status: Home / Self Care | Payer: Self-pay | Attending: Cardiology | Admitting: Cardiology

## 2013-06-02 DIAGNOSIS — R079 Chest pain, unspecified: Secondary | ICD-10-CM

## 2013-06-02 MED ORDER — TECHNETIUM TC 99M SESTAMIBI GENERIC - CARDIOLITE
30.0000 | Freq: Once | INTRAVENOUS | Status: AC | PRN
  Administered 2013-06-02: 30 via INTRAVENOUS

## 2013-06-02 NOTE — Progress Notes (Signed)
Subjective:  Patient denies any chest pain or shortness of breath walking in the hallway without any problems nuclear stress test results are still pending  Objective:  Vital Signs in the last 24 hours: Temp:  [98 F (36.7 C)-98.7 F (37.1 C)] 98.4 F (36.9 C) (08/22 0549) Pulse Rate:  [87-91] 91 (08/22 0549) Resp:  [17-18] 18 (08/22 0549) BP: (108-132)/(62-69) 108/65 mmHg (08/22 0549) SpO2:  [97 %-100 %] 97 % (08/22 0549)  Intake/Output from previous day: 08/21 0701 - 08/22 0700 In: 723 [P.O.:720; I.V.:3] Out: -  Intake/Output from this shift:    Physical Exam: Neck: no adenopathy, no carotid bruit, no JVD and supple, symmetrical, trachea midline Lungs: clear to auscultation bilaterally Heart: regular rate and rhythm, S1, S2 normal, no murmur, click, rub or gallop Abdomen: soft, non-tender; bowel sounds normal; no masses,  no organomegaly Extremities: extremities normal, atraumatic, no cyanosis or edema  Lab Results:  Recent Labs  05/30/13 2029 06/01/13 0501  WBC 9.2 6.5  HGB 10.9* 10.7*  PLT 292 255    Recent Labs  05/30/13 2029 06/01/13 0501  NA 136 138  K 3.8 4.4  CL 102 104  CO2 24 26  GLUCOSE 133* 160*  BUN 10 13  CREATININE 1.17 1.11    Recent Labs  05/31/13 0600  TROPONINI <0.30   Hepatic Function Panel  Recent Labs  06/01/13 0501  PROT 6.8  ALBUMIN 3.5  AST 21  ALT 35  ALKPHOS 89  BILITOT 0.4    Recent Labs  05/31/13 0600  CHOL 149   No results found for this basename: PROTIME,  in the last 72 hours  Imaging: Imaging results have been reviewed and No results found.  Cardiac Studies:  Assessment/Plan:  Status post atypical chest pain with some features worrisome for angina MI ruled out  Hypertension  Diabetes mellitus  Morbid obesity  Depression  Degenerative joint disease Plan Continue present management Okay to discharge home his nuclear scan shows no evidence of reversible ischemia from cardiac point of view.  LOS:  3 days    Camryn Lampson N 06/02/2013, 11:54 AM

## 2013-06-02 NOTE — Discharge Summary (Signed)
Physician Discharge Summary  Steve Pierce ZOX:[id number] DOB: 1970-12-23 DOA: 05/30/2013  PCP: Dartha Lodge, FNP  Admit date: 05/30/2013 Discharge date: 06/02/2013  Time spent: 26 minutes  Recommendations for Outpatient Follow-up:  1. Follow up as OP 2. Might need Cardiology f/uasOP 3. Get A1c 1 mo   Discharge Diagnoses:  Principal Problem:   Chest pain Active Problems:   DM2 (diabetes mellitus, type 2)   HTN (hypertension)   Discharge Condition: air  Diet recommendation: diabetic and heart healthy  Filed Weights   05/30/13 2330  Weight: 135.8 kg (299 lb 6.2 oz)    History of present illness:   42 y/o AAM admitted 05/30/13 with Sudden onset CP[ heart score of 4], crushing nature, known h/o htn,poorly controlled DM   Hospital Course:   1. ? Cardiogenic CP-2 Day stress was neg.  Further input from Cardiology-revelaed no further work-up needed or required and no med changes were requested.  HIs CP was atypical and thought to be 2/2 to deconditioning 2. Htn-continue Lisinopril 20 daily 3. DM-was on sensitive SSI and mealtime coverage-blood sugars 160-201-continue oral meds as OP 4. Bipolar-continue Lithium 300 bid, Risperidone 4 qhs  Consultants:  Cardiology Dr. Sharyn Lull 8/20 Procedures:  Stress test 8/21 and 8/22w was neg Antibiotics:  none   Discharge Exam: Filed Vitals:   06/02/13 0549  BP: 108/65  Pulse: 91  Temp: 98.4 F (36.9 C)  Resp: 18   Alert pleasant and anxious to go home  General:  Eomi, ncat Cardiovascular: s1 s2 no m/r/ Respiratory: clear   Discharge Instructions     Medication List    ASK your doctor about these medications       cholecalciferol 1000 UNITS tablet  Commonly known as:  VITAMIN D  Take 1,000 Units by mouth 2 (two) times daily.     diclofenac 75 MG EC tablet  Commonly known as:  VOLTAREN  Take 75 mg by mouth 2 (two) times daily as needed (for pain).     furosemide 40 MG tablet  Commonly known as:  LASIX  Take  40 mg by mouth daily.     glipiZIDE 5 MG tablet  Commonly known as:  GLUCOTROL  Take 5 mg by mouth every morning.     lisinopril 20 MG tablet  Commonly known as:  PRINIVIL,ZESTRIL  Take 20 mg by mouth daily.     lithium carbonate 300 MG capsule  Take 300 mg by mouth 2 (two) times daily with a meal.     meclizine 25 MG tablet  Commonly known as:  ANTIVERT  Take 25 mg by mouth 2 (two) times daily as needed for dizziness.     risperidone 4 MG tablet  Commonly known as:  RISPERDAL  Take 4 mg by mouth at bedtime.     sitaGLIPtan-metformin 50-500 MG per tablet  Commonly known as:  JANUMET  Take 1 tablet by mouth every morning.     topiramate 25 MG tablet  Commonly known as:  TOPAMAX  Take 50 mg by mouth 2 (two) times daily.       No Known Allergies    The results of significant diagnostics from this hospitalization (including imaging, microbiology, ancillary and laboratory) are listed below for reference.    Significant Diagnostic Studies: Dg Chest 2 View  05/30/2013   *RADIOLOGY REPORT*  Clinical Data: Chest pain and shortness of breath.  CHEST - 2 VIEW  Comparison: None.  Findings: Heart size is normal.  There is slight vascular prominence but the  patient has taken a very shallow inspiration. No consolidative infiltrates or effusions.  No osseous abnormalities.  IMPRESSION: No acute abnormality.   Original Report Authenticated By: Francene Boyers, M.D.    Microbiology: No results found for this or any previous visit (from the past 240 hour(s)).   Labs: Basic Metabolic Panel:  Recent Labs Lab 05/30/13 2029 06/01/13 0501  NA 136 138  K 3.8 4.4  CL 102 104  CO2 24 26  GLUCOSE 133* 160*  BUN 10 13  CREATININE 1.17 1.11  CALCIUM 9.3 9.3   Liver Function Tests:  Recent Labs Lab 05/30/13 2029 06/01/13 0501  AST 31 21  ALT 45 35  ALKPHOS 102 89  BILITOT 0.4 0.4  PROT 7.3 6.8  ALBUMIN 3.8 3.5   No results found for this basename: LIPASE, AMYLASE,  in the  last 168 hours No results found for this basename: AMMONIA,  in the last 168 hours CBC:  Recent Labs Lab 05/30/13 2029 06/01/13 0501  WBC 9.2 6.5  NEUTROABS 6.5  --   HGB 10.9* 10.7*  HCT 34.4* 33.7*  MCV 57.9* 58.4*  PLT 292 255   Cardiac Enzymes:  Recent Labs Lab 05/31/13 0600  TROPONINI <0.30   BNP: BNP (last 3 results)  Recent Labs  05/30/13 2029  PROBNP 15.6   CBG:  Recent Labs Lab 05/31/13 2105 06/01/13 0730 06/01/13 1217 06/01/13 1656 06/01/13 2128  GLUCAP 171* 159* 201* 98 170*       Signed:  Jazzmin Newbold, JAI-GURMUKH  [facility] 06/02/2013, 1:12 PM

## 2013-06-05 LAB — GLUCOSE, CAPILLARY

## 2014-12-12 ENCOUNTER — Ambulatory Visit (INDEPENDENT_AMBULATORY_CARE_PROVIDER_SITE_OTHER): Payer: Medicare Other | Admitting: Family Medicine

## 2014-12-12 ENCOUNTER — Encounter: Payer: Self-pay | Admitting: Family Medicine

## 2014-12-12 VITALS — BP 129/85 | HR 82 | Temp 98.2°F | Resp 16 | Ht 71.0 in | Wt 283.0 lb

## 2014-12-12 DIAGNOSIS — Z23 Encounter for immunization: Secondary | ICD-10-CM | POA: Diagnosis not present

## 2014-12-12 DIAGNOSIS — F331 Major depressive disorder, recurrent, moderate: Secondary | ICD-10-CM | POA: Diagnosis not present

## 2014-12-12 DIAGNOSIS — I10 Essential (primary) hypertension: Secondary | ICD-10-CM

## 2014-12-12 DIAGNOSIS — E1142 Type 2 diabetes mellitus with diabetic polyneuropathy: Secondary | ICD-10-CM | POA: Diagnosis not present

## 2014-12-12 DIAGNOSIS — R296 Repeated falls: Secondary | ICD-10-CM

## 2014-12-12 DIAGNOSIS — F32A Depression, unspecified: Secondary | ICD-10-CM

## 2014-12-12 DIAGNOSIS — F329 Major depressive disorder, single episode, unspecified: Secondary | ICD-10-CM | POA: Diagnosis not present

## 2014-12-12 DIAGNOSIS — Z8639 Personal history of other endocrine, nutritional and metabolic disease: Secondary | ICD-10-CM

## 2014-12-12 LAB — COMPLETE METABOLIC PANEL WITH GFR
ALBUMIN: 4.2 g/dL (ref 3.5–5.2)
ALK PHOS: 131 U/L — AB (ref 39–117)
ALT: 39 U/L (ref 0–53)
AST: 31 U/L (ref 0–37)
BILIRUBIN TOTAL: 0.6 mg/dL (ref 0.2–1.2)
BUN: 11 mg/dL (ref 6–23)
CALCIUM: 9.4 mg/dL (ref 8.4–10.5)
CO2: 25 mEq/L (ref 19–32)
Chloride: 98 mEq/L (ref 96–112)
Creat: 0.95 mg/dL (ref 0.50–1.35)
GFR, Est Non African American: >89 mL/min
Glucose, Bld: 254 mg/dL — ABNORMAL HIGH (ref 70–99)
Potassium: 4.2 mEq/L (ref 3.5–5.3)
SODIUM: 134 meq/L — AB (ref 135–145)
Total Protein: 7.3 g/dL (ref 6.0–8.3)

## 2014-12-12 LAB — CBC WITH DIFFERENTIAL/PLATELET
BASOS ABS: 0 10*3/uL (ref 0.0–0.1)
Basophils Relative: 0 % (ref 0–1)
EOS PCT: 1 % (ref 0–5)
Eosinophils Absolute: 0.1 10*3/uL (ref 0.0–0.7)
HCT: 36.6 % — ABNORMAL LOW (ref 39.0–52.0)
HEMOGLOBIN: 11.6 g/dL — AB (ref 13.0–17.0)
LYMPHS ABS: 1.7 10*3/uL (ref 0.7–4.0)
LYMPHS PCT: 30 % (ref 12–46)
MCH: 18.2 pg — AB (ref 26.0–34.0)
MCHC: 31.7 g/dL (ref 30.0–36.0)
MCV: 57.5 fL — AB (ref 78.0–100.0)
MONO ABS: 0.3 10*3/uL (ref 0.1–1.0)
MPV: 8.9 fL (ref 8.6–12.4)
Monocytes Relative: 5 % (ref 3–12)
NEUTROS ABS: 3.5 10*3/uL (ref 1.7–7.7)
Neutrophils Relative %: 64 % (ref 43–77)
Platelets: 327 10*3/uL (ref 150–400)
RBC: 6.37 MIL/uL — AB (ref 4.22–5.81)
RDW: 18.1 % — ABNORMAL HIGH (ref 11.5–15.5)
WBC: 5.5 10*3/uL (ref 4.0–10.5)

## 2014-12-12 LAB — GLUCOSE, CAPILLARY: Glucose-Capillary: 243 mg/dL — ABNORMAL HIGH (ref 70–99)

## 2014-12-12 NOTE — Progress Notes (Signed)
Subjective:    Patient ID: Steve Pierce, male    DOB: [birth year], 44 y.o.   MRN: [id number]  HPI Steve Pierce, a patient with a history of depression, hypertension, and diabetes mellitus presents accompanied by wife to establish care with a primary provider. Patient states that he was a patient of [facility], Dr. Dartha LodgeAnthony Steele.   Patient here for follow-up of elevated blood pressure. He is not exercising and is not adherent to low salt diet.   Patient denies chest pain, dyspnea, fatigue, orthopnea, palpitations, syncope and tachypnea.  Cardiovascular risk factors include: diabetes mellitus, dyslipidemia, microalbuminuria, obesity (BMI >= 30 kg/m2) and sedentary lifestyle.   Patient  also complaining of diabetes. He states that he was diagnosed several years ago. He states that he has not been taking medications consistently over the past 3 weeks. He maintains that he went out of town and did not take medications with him. He has not been following a carbohydrate modified diet or exercising.  Patient denies foot ulcerations, nausea, polydipsia, polyuria, visual disturbances and weight loss. Steve Pierce has not been checking sugars at home.   Steve Pierce has a history of peripheral neuropathy, primarily to lower extremities. He states that he has been experiencing symptoms of intermittent lower extremity weakness resulting in falls.  He maintains that is lower extremities will "give out" without warning. He states that symptoms began several months ago.  The patient denies fever, headache, blurred vision, syncope, dizziness, or weakness.   Patient complains of depression. He complains of anhedonia and depressed mood.  He denies suicidal or homicidal ideations.   He denies current suicidal and homicidal plan or intent.  Risk factors include: previous episode of depression. Current treatment includes Lithium and Lurasidone.   Past Medical History  Diagnosis Date  . Hypertension    . Diabetes mellitus without complication   . Tuberculosis   . Depression   . Arthritis    History   Social History  . Marital Status: Married    Spouse Name: N/A  . Number of Children: N/A  . Years of Education: N/A   Social History Main Topics  . Smoking status: Never Smoker   . Smokeless tobacco: Never Used  . Alcohol Use: Yes     Comment: occational beer or liquior   . Drug Use: No  . Sexual Activity: Not on file   Other Topics Concern  . None   Social History Narrative   Review of Systems  Constitutional: Negative for fever, fatigue and unexpected weight change.  HENT: Negative.   Eyes: Negative.  Negative for visual disturbance.  Respiratory: Negative.   Cardiovascular: Negative.  Negative for palpitations and leg swelling.  Gastrointestinal: Negative.   Endocrine: Negative.  Negative for polydipsia, polyphagia and polyuria.  Genitourinary: Negative.  Negative for frequency and flank pain.  Musculoskeletal: Negative.   Skin: Negative.   Allergic/Immunologic: Negative.   Neurological: Positive for dizziness, weakness (occassionally) and numbness (peripheral neuropathy to lower extremities). Negative for facial asymmetry.  Hematological: Negative.   Psychiatric/Behavioral: Negative.  Negative for sleep disturbance and self-injury.       Objective:   Physical Exam  Constitutional: He is oriented to person, place, and time. He appears well-developed and well-nourished. He does not have a sickly appearance. No distress.  HENT:  Head: Normocephalic and atraumatic.  Right Ear: External ear normal.  Left Ear: External ear normal.  Nose: Nose normal.  Mouth/Throat: Oropharynx is clear and moist.  Eyes: Conjunctivae, EOM  and lids are normal. Pupils are equal, round, and reactive to light. Lids are everted and swept, no foreign bodies found.  Neck: Normal range of motion and full passive range of motion without pain. Neck supple. Normal range of motion present.   Cardiovascular: Normal rate, regular rhythm, normal heart sounds and intact distal pulses.   Pulmonary/Chest: Effort normal and breath sounds normal. No apnea. No respiratory distress. He has no decreased breath sounds.  Abdominal: Soft. Normal appearance and bowel sounds are normal. There is no tenderness.  Musculoskeletal: Normal range of motion.  Neurological: He is alert and oriented to person, place, and time. He has normal strength and normal reflexes. He displays no atrophy, no tremor and normal reflexes. No cranial nerve deficit or sensory deficit. He exhibits normal muscle tone. He displays a negative Romberg sign. He displays no seizure activity. Coordination and gait normal.  Reflex Scores:      Tricep reflexes are 2+ on the right side and 2+ on the left side.      Bicep reflexes are 2+ on the right side and 2+ on the left side.      Brachioradialis reflexes are 2+ on the right side and 2+ on the left side.      Patellar reflexes are 2+ on the right side and 2+ on the left side.      Achilles reflexes are 2+ on the right side and 2+ on the left side. Skin: Skin is warm and dry.  Psychiatric: He has a normal mood and affect. His behavior is normal. Judgment and thought content normal.       BP 129/85 mmHg  Pulse 82  Temp(Src) 98.2 F (36.8 C) (Oral)  Resp 16  Ht  (1.803 m)  Wt 283 lb (128.368 kg)  BMI 39.49 kg/m2 Assessment & Plan:   1. Essential hypertension Blood pressure at goal on current blood pressure regimen. Patient states that he has had frequent falls without warning. I will check orthostatic vital signs.  He states that he has not been taking medications consistently for 2 weeks - Orthostatic vital signs - COMPLETE METABOLIC PANEL WITH GFR - Urinalysis, Complete - lisinopril (PRINIVIL,ZESTRIL) 20 MG tablet; Take 1 tablet (20 mg total) by mouth daily.  Dispense: 30 tablet; Refill: 2  2. Type 2 diabetes mellitus with diabetic polyneuropathy Blood sugar is  currently 243, he states that he has not been following a carbohydrate modified modified diet or exercise regimen. Also, [name] states that he has not taken diabetes medications in greater than 2 weeks. I will check hemoglobin A1c, previous A1C was reviewed which was 7.3. I will also send to diabetes and nutrition management.  ,- Hemoglobin A1c - CBC with Differential - sitaGLIPtin-metformin (JANUMET) 50-1000 MG per tablet; Take 1 tablet by mouth 2 (two) times daily with a meal.  Dispense: 30 tablet; Refill: 2 - glipiZIDE (GLUCOTROL) 5 MG tablet; Take 1 tablet (5 mg total) by mouth every morning.  Dispense: 30 tablet; Refill: 2   3. Major depressive disorder, recurrent episode, moderate Depression is controlled on current medication regimen. He denies suicidal or homicidal ideation.  Mr. Krotzer is currently under the care of Dr. Scharlene Gloss, psychiatrist for major depression. He has an appointment to follow up with Dr. Scharlene Gloss on Friday, 12/14/2014. Patient is currently on Lithium, he states that he has not had a lithium level drawn recently. Patient will need to be tested for lithium toxicity due to recent falls.  4. Frequent falls  Mr. Catterton reports frequent falls. His wife states that his legs will "give out" without warning. He has a history of neuropathy and states that he has been loosing his balance over the past several months. Reviewed medications at length, patient is currently on gabapentin for neuropathy. Will continue gabapentin and will bring Mr. Sobolewski back in 2 weeks for a 6 minute walk test to reproduce symptoms.  We will also obtain a lithium level to assess for lithiium toxicity, he reports that it has been several months since lithium levels were checked.   5. History of hyperlipidemia Patient reports a history of of high cholesterol. He states that he has not been on medications in the past for high cholesterol. Discussed lowfat, low salt diet.  Will check fasting lipid  panel. -lipid panel  6.  Need for Tdap vaccination - Tdap vaccine greater than or equal to 7yo IM  7. Immunization due - Pneumococcal polysaccharide vaccine 23-valent greater than or equal to 2yo subcutaneous/IM   Shanavia Makela M, FNP  RTC:  2 weeks for 6 minute walk test 3 months for follow-up

## 2014-12-13 DIAGNOSIS — F32A Depression, unspecified: Secondary | ICD-10-CM | POA: Insufficient documentation

## 2014-12-13 DIAGNOSIS — Z8639 Personal history of other endocrine, nutritional and metabolic disease: Secondary | ICD-10-CM | POA: Insufficient documentation

## 2014-12-13 DIAGNOSIS — R296 Repeated falls: Secondary | ICD-10-CM | POA: Insufficient documentation

## 2014-12-13 DIAGNOSIS — F329 Major depressive disorder, single episode, unspecified: Secondary | ICD-10-CM | POA: Insufficient documentation

## 2014-12-13 LAB — URINALYSIS, COMPLETE
BACTERIA UA: NONE SEEN
BILIRUBIN URINE: NEGATIVE
CASTS: NONE SEEN
Crystals: NONE SEEN
HGB URINE DIPSTICK: NEGATIVE
LEUKOCYTES UA: NEGATIVE
Nitrite: NEGATIVE
PH: 5.5 (ref 5.0–8.0)
PROTEIN: NEGATIVE mg/dL
Specific Gravity, Urine: >1.03 — ABNORMAL HIGH (ref 1.005–1.030)
Squamous Epithelial / LPF: NONE SEEN
Urobilinogen, UA: 0.2 mg/dL (ref 0.0–1.0)

## 2014-12-13 LAB — HEMOGLOBIN A1C
Hgb A1c MFr Bld: 10.9 % — ABNORMAL HIGH (ref <5.7)
MEAN PLASMA GLUCOSE: 266 mg/dL — AB (ref <117)

## 2014-12-13 MED ORDER — LISINOPRIL 20 MG PO TABS: 20.0000 mg | ORAL_TABLET | Freq: Every day | ORAL | Status: AC

## 2014-12-13 MED ORDER — SITAGLIPTIN PHOS-METFORMIN HCL 50-1000 MG PO TABS: 1.0000 | ORAL_TABLET | Freq: Two times a day (BID) | ORAL | Status: DC

## 2014-12-13 MED ORDER — GABAPENTIN 400 MG PO CAPS: 400.0000 mg | ORAL_CAPSULE | Freq: Four times a day (QID) | ORAL | Status: AC

## 2014-12-13 MED ORDER — GLIPIZIDE 5 MG PO TABS: 5.0000 mg | ORAL_TABLET | Freq: Every morning | ORAL | Status: DC

## 2014-12-26 ENCOUNTER — Other Ambulatory Visit: Payer: Self-pay | Admitting: Family Medicine

## 2014-12-26 DIAGNOSIS — E1142 Type 2 diabetes mellitus with diabetic polyneuropathy: Secondary | ICD-10-CM

## 2014-12-26 MED ORDER — METFORMIN HCL 1000 MG PO TABS: 1000.0000 mg | ORAL_TABLET | Freq: Two times a day (BID) | ORAL | Status: AC

## 2014-12-26 MED ORDER — SITAGLIPTIN PHOSPHATE 100 MG PO TABS: 100.0000 mg | ORAL_TABLET | Freq: Every day | ORAL | Status: AC

## 2014-12-26 NOTE — Progress Notes (Signed)
Meds ordered this encounter  Medications  . sitaGLIPtin (JANUVIA) 100 MG tablet    Sig: Take 1 tablet (100 mg total) by mouth daily.    Dispense:  30 tablet    Refill:  2    Order Specific Question:  Supervising Provider    Answer:  MATTHEWS, MICHELLE A [[id number]]  . metFORMIN (GLUCOPHAGE) 1000 MG tablet    Sig: Take 1 tablet (1,000 mg total) by mouth 2 (two) times daily with a meal.    Dispense:  60 tablet    Refill:  2    Order Specific Question:  Supervising Provider    Answer:  Marthann SchillerMATTHEWS, MICHELLE A [[id number]]  Patient's insurance will not cover Janumet combination. Modified medication.

## 2014-12-28 ENCOUNTER — Encounter (INDEPENDENT_AMBULATORY_CARE_PROVIDER_SITE_OTHER): Payer: Medicaid Other

## 2014-12-28 ENCOUNTER — Telehealth: Payer: Self-pay

## 2014-12-28 NOTE — Telephone Encounter (Signed)
Patient came in today for a 6 minute walk test. Ambulated patient down the hall for 6 minutes. Pulse ox stayed between 98-99% on room air entire time. Pulse stayed at 114. No giving out of extremities noted. Thanks!

## 2015-01-25 ENCOUNTER — Ambulatory Visit: Payer: Medicare Other | Admitting: Family Medicine

## 2015-03-14 ENCOUNTER — Ambulatory Visit: Payer: Medicaid Other | Admitting: Family Medicine

## 2015-04-12 ENCOUNTER — Emergency Department (HOSPITAL_COMMUNITY): Payer: Medicare Other

## 2015-04-12 ENCOUNTER — Encounter (HOSPITAL_COMMUNITY): Payer: Self-pay | Admitting: Emergency Medicine

## 2015-04-12 ENCOUNTER — Emergency Department (HOSPITAL_COMMUNITY)
Admission: EM | Admit: 2015-04-12 | Discharge: 2015-04-12 | Disposition: A | Discharge status: Home / Self Care | Payer: Medicare Other | Attending: Emergency Medicine | Admitting: Emergency Medicine

## 2015-04-12 DIAGNOSIS — E119 Type 2 diabetes mellitus without complications: Secondary | ICD-10-CM | POA: Diagnosis not present

## 2015-04-12 DIAGNOSIS — M1990 Unspecified osteoarthritis, unspecified site: Secondary | ICD-10-CM | POA: Diagnosis not present

## 2015-04-12 DIAGNOSIS — I10 Essential (primary) hypertension: Secondary | ICD-10-CM | POA: Insufficient documentation

## 2015-04-12 DIAGNOSIS — R0789 Other chest pain: Secondary | ICD-10-CM | POA: Diagnosis not present

## 2015-04-12 DIAGNOSIS — Z7982 Long term (current) use of aspirin: Secondary | ICD-10-CM | POA: Insufficient documentation

## 2015-04-12 DIAGNOSIS — R079 Chest pain, unspecified: Secondary | ICD-10-CM

## 2015-04-12 DIAGNOSIS — Z8659 Personal history of other mental and behavioral disorders: Secondary | ICD-10-CM | POA: Diagnosis not present

## 2015-04-12 DIAGNOSIS — Z791 Long term (current) use of non-steroidal anti-inflammatories (NSAID): Secondary | ICD-10-CM | POA: Diagnosis not present

## 2015-04-12 DIAGNOSIS — Z8611 Personal history of tuberculosis: Secondary | ICD-10-CM | POA: Insufficient documentation

## 2015-04-12 DIAGNOSIS — Z79899 Other long term (current) drug therapy: Secondary | ICD-10-CM | POA: Diagnosis not present

## 2015-04-12 LAB — I-STAT TROPONIN, ED: Troponin i, poc: 0 ng/mL (ref 0.00–0.08)

## 2015-04-12 LAB — BASIC METABOLIC PANEL
Anion gap: 10 (ref 5–15)
BUN: 11 mg/dL (ref 6–20)
CO2: 26 mmol/L (ref 22–32)
CREATININE: 1.13 mg/dL (ref 0.61–1.24)
Calcium: 8.9 mg/dL (ref 8.9–10.3)
Chloride: 99 mmol/L — ABNORMAL LOW (ref 101–111)
GFR calc Af Amer: >60 mL/min (ref >60)
GFR calc non Af Amer: >60 mL/min (ref >60)
GLUCOSE: 248 mg/dL — AB (ref 65–99)
Potassium: 4.2 mmol/L (ref 3.5–5.1)
SODIUM: 135 mmol/L (ref 135–145)

## 2015-04-12 LAB — CBC
HCT: 38.1 % — ABNORMAL LOW (ref 39.0–52.0)
HEMOGLOBIN: 11.8 g/dL — AB (ref 13.0–17.0)
MCH: 18.5 pg — AB (ref 26.0–34.0)
MCHC: 31 g/dL (ref 30.0–36.0)
MCV: 59.7 fL — ABNORMAL LOW (ref 78.0–100.0)
Platelets: 248 10*3/uL (ref 150–400)
RBC: 6.38 MIL/uL — AB (ref 4.22–5.81)
RDW: 16.5 % — ABNORMAL HIGH (ref 11.5–15.5)
WBC: 5.5 10*3/uL (ref 4.0–10.5)

## 2015-04-12 LAB — BRAIN NATRIURETIC PEPTIDE: B Natriuretic Peptide: 7.6 pg/mL (ref 0.0–100.0)

## 2015-04-12 MED ORDER — IOHEXOL 350 MG/ML SOLN
100.0000 mL | Freq: Once | INTRAVENOUS | Status: AC | PRN
  Administered 2015-04-12: 100 mL via INTRAVENOUS

## 2015-04-12 MED ORDER — MORPHINE SULFATE 4 MG/ML IJ SOLN
4.0000 mg | INTRAMUSCULAR | Status: DC | PRN
  Administered 2015-04-12 (×2): 4 mg via INTRAVENOUS
  Filled 2015-04-12 (×2): qty 1

## 2015-04-12 MED ORDER — ONDANSETRON HCL 4 MG/2ML IJ SOLN
4.0000 mg | Freq: Once | INTRAMUSCULAR | Status: AC
  Administered 2015-04-12: 4 mg via INTRAVENOUS
  Filled 2015-04-12: qty 2

## 2015-04-12 MED ORDER — HYDROCODONE-ACETAMINOPHEN 5-325 MG PO TABS
1.0000 | ORAL_TABLET | ORAL | Status: AC | PRN
Start: 2015-04-12 — End: ?

## 2015-04-12 MED ORDER — NAPROXEN 500 MG PO TABS: 500.0000 mg | ORAL_TABLET | Freq: Two times a day (BID) | ORAL | Status: DC

## 2015-04-12 NOTE — ED Notes (Signed)
Pt reports R side chest pain for the past 3 weeks that became worse today. Went to PCP for same yesterday and was told to come to ED, but wife persuaded him to come today. Also reports SOB and lightheadedness. Pt speaking full sentences with no difficultly. Also has had recent cough and sore throat.

## 2015-04-12 NOTE — ED Provider Notes (Signed)
CSN: [id number]     Arrival date & time 04/12/15  1649 History   First MD Initiated Contact with Patient 04/12/15 1712     Chief Complaint  Patient presents with  . Chest Pain      HPI  Patient presents for evaluation of right-sided chest pain. He states he had an evaluation year ago for the same thing and was admitted for several days. Cannot recall any testing. However, he has never had an angiogram. He is on the right side of his chest immediately adjacent to the sternum. He states it hurts to move. He states that it hurts to cough "if I cough hard". He does not have a spontaneous cough. No sputum. No fever. States is "always short of breath". Pain has been present for the vast majority the time for the last month. He states he was seen at his primary care physician a month ago and told to come here. Instead he states he "went home and laid on the couch". No history of DVT or PE. No leg swelling. No recent prolonged immobilization cast splints fractures surgeries malignancies or other DVT or PE risks.  Past Medical History  Diagnosis Date  . Hypertension   . Diabetes mellitus without complication   . Tuberculosis   . Depression   . Arthritis    Past Surgical History  Procedure Laterality Date  . Hernia repair     Family History  Problem Relation Age of Onset  . CAD Neg Hx    History  Substance Use Topics  . Smoking status: Never Smoker   . Smokeless tobacco: Never Used  . Alcohol Use: Yes     Comment: occational beer or liquior     Review of Systems  Constitutional: Negative for fever, chills, diaphoresis, appetite change and fatigue.  HENT: Negative for mouth sores, sore throat and trouble swallowing.   Eyes: Negative for visual disturbance.  Respiratory: Positive for shortness of breath. Negative for cough, chest tightness and wheezing.   Cardiovascular: Positive for chest pain.  Gastrointestinal: Negative for nausea, vomiting, abdominal pain, diarrhea and abdominal  distention.  Endocrine: Negative for polydipsia, polyphagia and polyuria.  Genitourinary: Negative for dysuria, frequency and hematuria.  Musculoskeletal: Negative for gait problem.  Skin: Negative for color change, pallor and rash.  Neurological: Negative for dizziness, syncope, light-headedness and headaches.  Hematological: Does not bruise/bleed easily.  Psychiatric/Behavioral: Negative for behavioral problems and confusion.      Allergies  Review of patient's allergies indicates no known allergies.  Home Medications   Prior to Admission medications   Medication Sig Start Date End Date Taking? Authorizing Provider  amoxicillin (AMOXIL) 875 MG tablet Take 1 tablet by mouth twice a day for 7 days 04/11/15  Yes Historical Provider, MD  aspirin 325 MG tablet Take 325 mg by mouth daily.   Yes Historical Provider, MD  diclofenac (VOLTAREN) 75 MG EC tablet Take 75 mg by mouth 2 (two) times daily.   Yes Historical Provider, MD  furosemide (LASIX) 40 MG tablet Take 40 mg by mouth daily. 04/11/15  Yes Historical Provider, MD  gabapentin (NEURONTIN) 400 MG capsule Take 1 capsule (400 mg total) by mouth 4 (four) times daily. Patient taking differently: Take 400 mg by mouth 3 (three) times daily.  12/13/14  Yes Massie Maroon, FNP  glipiZIDE (GLUCOTROL) 5 MG tablet Take 1 tablet (5 mg total) by mouth every morning. 12/13/14  Yes Massie Maroon, FNP  LATUDA 40 MG TABS tablet Take 40  mg by mouth at bedtime.  03/18/15  Yes Historical Provider, MD  lisinopril (PRINIVIL,ZESTRIL) 20 MG tablet Take 1 tablet (20 mg total) by mouth daily. 12/13/14  Yes Massie Maroon, FNP  lithium carbonate 300 MG capsule Take 300 mg by mouth 2 (two) times daily with a meal.   Yes Historical Provider, MD  methocarbamol (ROBAXIN) 750 MG tablet take 1 tablet by mouth twice a day if needed muscle spasm 04/11/15  Yes Historical Provider, MD  topiramate (TOPAMAX) 100 MG tablet Take 100 mg by mouth 2 (two) times daily. 04/11/15  Yes  Historical Provider, MD  cholecalciferol (VITAMIN D) 1000 UNITS tablet Take 1,000 Units by mouth 2 (two) times daily.    Historical Provider, MD  JANUMET 50-1000 MG per tablet Take 1 tablet by mouth 2 (two) times daily with a meal.  03/18/15   Historical Provider, MD  metFORMIN (GLUCOPHAGE) 1000 MG tablet Take 1 tablet (1,000 mg total) by mouth 2 (two) times daily with a meal. Patient not taking: Reported on 04/12/2015 12/26/14   Massie Maroon, FNP  sitaGLIPtin (JANUVIA) 100 MG tablet Take 1 tablet (100 mg total) by mouth daily. Patient not taking: Reported on 04/12/2015 12/26/14   Massie Maroon, FNP   BP 121/68 mmHg  Pulse 102  Resp 17  SpO2 99% Physical Exam  Constitutional: He is oriented to person, place, and time. He appears well-developed and well-nourished. No distress.  HENT:  Head: Normocephalic.  Eyes: Conjunctivae are normal. Pupils are equal, round, and reactive to light. No scleral icterus.  Neck: Normal range of motion. Neck supple. No thyromegaly present.  Cardiovascular: Normal rate and regular rhythm.  Exam reveals no gallop and no friction rub.   No murmur heard. Pulmonary/Chest: Effort normal and breath sounds normal. No respiratory distress. He has no wheezes. He has no rales.    Patient is tender along the right pectoralis and right costal margin immediately adjacent the right sternum. Clear bilateral breath sounds. No crepitus. No vesicles.  Abdominal: Soft. Bowel sounds are normal. He exhibits no distension. There is no tenderness. There is no rebound.  Musculoskeletal: Normal range of motion.  Neurological: He is alert and oriented to person, place, and time.  Skin: Skin is warm and dry. No rash noted.  Psychiatric: He has a normal mood and affect. His behavior is normal.    ED Course  Procedures (including critical care time) Labs Review Labs Reviewed  BASIC METABOLIC PANEL - Abnormal; Notable for the following:    Chloride 99 (*)    Glucose, Bld 248 (*)      All other components within normal limits  CBC - Abnormal; Notable for the following:    RBC 6.38 (*)    Hemoglobin 11.8 (*)    HCT 38.1 (*)    MCV 59.7 (*)    MCH 18.5 (*)    RDW 16.5 (*)    All other components within normal limits  BRAIN NATRIURETIC PEPTIDE  I-STAT TROPOININ, ED    Imaging Review Dg Chest 2 View  04/12/2015   CLINICAL DATA:  Chest pain on the right for past 3 weeks, worsening today. Shortness of breath, lightheadedness.  EXAM: CHEST  2 VIEW  COMPARISON:  05/30/2013  FINDINGS: The heart size and mediastinal contours are within normal limits. Both lungs are clear. The visualized skeletal structures are unremarkable.  IMPRESSION: No active cardiopulmonary disease.   Electronically Signed   By: Charlett Nose M.D.   On: 04/12/2015 17:30  Ct Angio Chest Pe W/cm &/or Wo Cm  04/12/2015   CLINICAL DATA:  Right chest pain for 1 month became much worse the past 2 days. Shortness of breath.  EXAM: CT ANGIOGRAPHY CHEST WITH CONTRAST  TECHNIQUE: Multidetector CT imaging of the chest was performed using the standard protocol during bolus administration of intravenous contrast. Multiplanar CT image reconstructions and MIPs were obtained to evaluate the vascular anatomy.  CONTRAST:  100mL OMNIPAQUE IOHEXOL 350 MG/ML SOLN  COMPARISON:  Chest x-ray April 12, 2015  FINDINGS: There is no pulmonary embolus. The aorta is normal. There is no mediastinal or hilar lymphadenopathy. The heart size is normal. There is no pericardial effusion. The lung windows demonstrate no focal pulmonary mass, pneumonia, or pleural effusion.  Images of visualized upper abdominal structures demonstrate diffuse fatty infiltration of liver. The other visualized upper abdominal structures are unremarkable. Images of the bones demonstrate minimal degenerative joint changes of thoracic spine.  Review of the MIP images confirms the above findings.  IMPRESSION: No pulmonary embolus.  No acute abnormality identified.    Electronically Signed   By: Sherian ReinWei-Chen  Lin M.D.   On: 04/12/2015 19:19     EKG Interpretation None      MDM   Final diagnoses:  Right-sided chest pain    Reassuring studies. No evidence for myocardial ischemia. No PE or infiltrate on CT. On exam his pain is reproducible with movement and palpation. No obvious inciting injury or event. Pleasant laboratories and limited number, pain control, primary care follow-up.    Rolland PorterMark Alva, MD 04/12/15 2005

## 2015-04-12 NOTE — Discharge Instructions (Signed)
Avoid activities that make your pain worse.  Check with your primary care physician if not resolving by Tuesday.   Chest Wall Pain Chest wall pain is pain in or around the bones and muscles of your chest. It may take up to 6 weeks to get better. It may take longer if you must stay physically active in your work and activities.  CAUSES  Chest wall pain may happen on its own. However, it may be caused by:  A viral illness like the flu.  Injury.  Coughing.  Exercise.  Arthritis.  Fibromyalgia.  Shingles. HOME CARE INSTRUCTIONS   Avoid overtiring physical activity. Try not to strain or perform activities that cause pain. This includes any activities using your chest or your abdominal and side muscles, especially if heavy weights are used.  Put ice on the sore area.  Put ice in a plastic bag.  Place a towel between your skin and the bag.  Leave the ice on for 15-20 minutes per hour while awake for the first 2 days.  Only take over-the-counter or prescription medicines for pain, discomfort, or fever as directed by your caregiver. SEEK IMMEDIATE MEDICAL CARE IF:   Your pain increases, or you are very uncomfortable.  You have a fever.  Your chest pain becomes worse.  You have new, unexplained symptoms.  You have nausea or vomiting.  You feel sweaty or lightheaded.  You have a cough with phlegm (sputum), or you cough up blood. MAKE SURE YOU:   Understand these instructions.  Will watch your condition.  Will get help right away if you are not doing well or get worse. Document Released: 09/28/2005 Document Revised: 12/21/2011 Document Reviewed: 05/25/2011 [facility] Patient Information 2015 [place], [place]. This information is not intended to replace advice given to you by your health care provider. Make sure you discuss any questions you have with your health care provider.

## 2016-02-24 ENCOUNTER — Emergency Department (HOSPITAL_COMMUNITY)
Admission: EM | Admit: 2016-02-24 | Discharge: 2016-02-24 | Disposition: A | Discharge status: Home / Self Care | Payer: Medicare Other | Attending: Emergency Medicine | Admitting: Emergency Medicine

## 2016-02-24 ENCOUNTER — Encounter (HOSPITAL_COMMUNITY): Payer: Self-pay | Admitting: Emergency Medicine

## 2016-02-24 ENCOUNTER — Emergency Department (HOSPITAL_COMMUNITY): Payer: Medicare Other

## 2016-02-24 DIAGNOSIS — Z791 Long term (current) use of non-steroidal anti-inflammatories (NSAID): Secondary | ICD-10-CM | POA: Insufficient documentation

## 2016-02-24 DIAGNOSIS — Z7984 Long term (current) use of oral hypoglycemic drugs: Secondary | ICD-10-CM | POA: Diagnosis not present

## 2016-02-24 DIAGNOSIS — F329 Major depressive disorder, single episode, unspecified: Secondary | ICD-10-CM | POA: Diagnosis not present

## 2016-02-24 DIAGNOSIS — I10 Essential (primary) hypertension: Secondary | ICD-10-CM | POA: Insufficient documentation

## 2016-02-24 DIAGNOSIS — Z7982 Long term (current) use of aspirin: Secondary | ICD-10-CM | POA: Diagnosis not present

## 2016-02-24 DIAGNOSIS — Z79899 Other long term (current) drug therapy: Secondary | ICD-10-CM | POA: Insufficient documentation

## 2016-02-24 DIAGNOSIS — Z792 Long term (current) use of antibiotics: Secondary | ICD-10-CM | POA: Insufficient documentation

## 2016-02-24 DIAGNOSIS — G8929 Other chronic pain: Secondary | ICD-10-CM | POA: Diagnosis not present

## 2016-02-24 DIAGNOSIS — Z79891 Long term (current) use of opiate analgesic: Secondary | ICD-10-CM | POA: Insufficient documentation

## 2016-02-24 DIAGNOSIS — M179 Osteoarthritis of knee, unspecified: Secondary | ICD-10-CM | POA: Diagnosis not present

## 2016-02-24 DIAGNOSIS — E119 Type 2 diabetes mellitus without complications: Secondary | ICD-10-CM | POA: Diagnosis not present

## 2016-02-24 DIAGNOSIS — M25561 Pain in right knee: Secondary | ICD-10-CM | POA: Insufficient documentation

## 2016-02-24 MED ORDER — MELOXICAM 7.5 MG PO TABS: 15.0000 mg | ORAL_TABLET | Freq: Every day | ORAL | Status: DC

## 2016-02-24 NOTE — ED Notes (Signed)
Pt c/o R knee pain x 3 days. Pt has hx of arthritis and pain in this knee. Pt denies injury. Pt denies swelling to knee. A&Ox4 and ambulatory. Pt has knee brace on at this time.

## 2016-02-24 NOTE — Discharge Instructions (Signed)
Take your medication as prescribed. If you notice any unusual changes in your behavior, discontinue medication. [facility] your doctor [facility] for further [facility] of your symptoms. Return to ED for new or worsening symptoms.

## 2016-02-24 NOTE — ED Provider Notes (Signed)
CSN: [id number]     Arrival date & time 02/24/16  1015 History   First MD Initiated Contact with Patient 02/24/16 1051     Chief Complaint  Patient presents with  . Knee Pain     (Consider location/radiation/quality/duration/timing/severity/associated sxs/prior Treatment) HPI Steve Pierce is a 45 y.o. male with a history of arthritis in his right knee, comes in for evaluation of right knee pain. Patient reports gradual discomfort of right knee, worsening over the past 3 days after walking more on Saturday. Has been using a knee brace, methocarbamol without relief of his discomfort. Denies any redness, swelling. No fevers, chills, numbness or weakness. Certain movements and walking worsens discomfort. Reports sometimes the discomfort radiates into right hip.  Past Medical History  Diagnosis Date  . Hypertension   . Diabetes mellitus without complication (HCC)   . Tuberculosis   . Depression   . Arthritis    Past Surgical History  Procedure Laterality Date  . Hernia repair     Family History  Problem Relation Age of Onset  . CAD Neg Hx    Social History  Substance Use Topics  . Smoking status: Never Smoker   . Smokeless tobacco: Never Used  . Alcohol Use: Yes     Comment: occational beer or liquior     Review of Systems  A 10 point review of systems was completed and was negative except for pertinent positives and negatives as mentioned in the history of present illness     Allergies  Review of patient's allergies indicates no known allergies.  Home Medications   Prior to Admission medications   Medication Sig Start Date End Date Taking? Authorizing Provider  amoxicillin (AMOXIL) 875 MG tablet Take 1 tablet by mouth twice a day for 7 days 04/11/15   Historical Provider, MD  aspirin 325 MG tablet Take 325 mg by mouth daily.    Historical Provider, MD  cholecalciferol (VITAMIN D) 1000 UNITS tablet Take 1,000 Units by mouth 2 (two) times daily.    Historical Provider, MD   diclofenac (VOLTAREN) 75 MG EC tablet Take 75 mg by mouth 2 (two) times daily.    Historical Provider, MD  furosemide (LASIX) 40 MG tablet Take 40 mg by mouth daily. 04/11/15   Historical Provider, MD  gabapentin (NEURONTIN) 400 MG capsule Take 1 capsule (400 mg total) by mouth 4 (four) times daily. Patient taking differently: Take 400 mg by mouth 3 (three) times daily.  12/13/14   Massie Maroon, FNP  glipiZIDE (GLUCOTROL) 5 MG tablet Take 1 tablet (5 mg total) by mouth every morning. 12/13/14   Massie Maroon, FNP  HYDROcodone-acetaminophen (NORCO/VICODIN) 5-325 MG per tablet Take 1 tablet by mouth every 4 (four) hours as needed. 04/12/15   Rolland Porter, MD  JANUMET 50-1000 MG per tablet Take 1 tablet by mouth 2 (two) times daily with a meal.  03/18/15   Historical Provider, MD  LATUDA 40 MG TABS tablet Take 40 mg by mouth at bedtime.  03/18/15   Historical Provider, MD  lisinopril (PRINIVIL,ZESTRIL) 20 MG tablet Take 1 tablet (20 mg total) by mouth daily. 12/13/14   Massie Maroon, FNP  lithium carbonate 300 MG capsule Take 300 mg by mouth 2 (two) times daily with a meal.    Historical Provider, MD  meloxicam (MOBIC) 7.5 MG tablet Take 2 tablets (15 mg total) by mouth daily. 02/24/16   Joycie Peek, PA-C  metFORMIN (GLUCOPHAGE) 1000 MG tablet Take 1 tablet (1,000 mg  total) by mouth 2 (two) times daily with a meal. Patient not taking: Reported on 04/12/2015 12/26/14   Massie MaroonLachina M Hollis, FNP  methocarbamol (ROBAXIN) 750 MG tablet take 1 tablet by mouth twice a day if needed muscle spasm 04/11/15   Historical Provider, MD  naproxen (NAPROSYN) 500 MG tablet Take 1 tablet (500 mg total) by mouth 2 (two) times daily. 04/12/15   Rolland PorterMark Dezmin, MD  sitaGLIPtin (JANUVIA) 100 MG tablet Take 1 tablet (100 mg total) by mouth daily. Patient not taking: Reported on 04/12/2015 12/26/14   Massie MaroonLachina M Hollis, FNP  topiramate (TOPAMAX) 100 MG tablet Take 100 mg by mouth 2 (two) times daily. 04/11/15   Historical Provider, MD   BP  135/99 mmHg  Pulse 93  Resp 18  SpO2 98% Physical Exam  Constitutional:  Awake, alert, nontoxic appearance.  HENT:  Head: Atraumatic.  Eyes: Right eye exhibits no discharge. Left eye exhibits no discharge.  Neck: Neck supple.  Cardiovascular: Normal rate, regular rhythm, normal heart sounds and intact distal pulses.   Pulmonary/Chest: Effort normal. He exhibits no tenderness.  Abdominal: Soft. There is no tenderness. There is no rebound.  Musculoskeletal: He exhibits no tenderness.  Baseline ROM, no obvious new focal weakness. No focal tenderness in right knee. Maintains full active range of motion. No erythema, edema or other joint effusion appreciated. No ligamentous laxity. Sensation intact distal to joint pain. Muscle compartments soft Patient has been wearing shoes untied.  Neurological:  Mental status and motor strength appears baseline for patient and situation.  Skin: No rash noted.  Psychiatric: He has a normal mood and affect.  Nursing note and vitals reviewed.   ED Course  Procedures (including critical care time) Labs Review Labs Reviewed - No data to display  Imaging Review Dg Knee Complete 4 Views Right  02/24/2016  CLINICAL DATA:  Two week history of progressive knee pain. No history of recent trauma EXAM: RIGHT KNEE - COMPLETE 4+ VIEW COMPARISON:  None. FINDINGS: Frontal, lateral, and bilateral oblique views were obtained. There is no demonstrable fracture or dislocation. No appreciable joint effusion. The joint spaces appear normal. No erosive change. IMPRESSION: No fracture or joint effusion.  No appreciable arthropathy. Electronically Signed   By: Bretta BangWilliam  Woodruff III M.D.   On: 02/24/2016 10:43   I have personally reviewed and evaluated these images and lab results as part of my medical decision-making.   EKG Interpretation None     Filed Vitals:   02/24/16 1212  BP: 135/99  Pulse: 93  Resp: 18  SpO2: 98%    MDM  Steve Pierce is a 45 y.o. male   history of arthritis and right knee here for evaluation of acute knee pain. Symptoms appear to be consistent with arthritis type pain. Exam is unremarkable, x-ray without fracture or joint effusion. Low suspicion for septic arthritis, fracture hemarthrosis or other emergent pathology. Will DC with short course NSAIDs and follow up with PCP. Overall appears well, hemodynamically stable afebrile and appropriate for discharge Final diagnoses:  Knee pain, chronic, right        Joycie PeekBenjamin Jameika Kinn, PA-C 02/24/16 1359  Joycie PeekBenjamin Rogelio Winbush, PA-C 02/24/16 1400  Leta BaptistEmily Roe Nguyen, MD 02/27/16 1034

## 2016-05-19 ENCOUNTER — Other Ambulatory Visit (HOSPITAL_COMMUNITY): Payer: Self-pay | Admitting: Specialist

## 2016-05-19 DIAGNOSIS — E0590 Thyrotoxicosis, unspecified without thyrotoxic crisis or storm: Secondary | ICD-10-CM

## 2016-05-28 ENCOUNTER — Encounter (HOSPITAL_COMMUNITY)
Admission: RE | Admit: 2016-05-28 | Discharge: 2016-05-28 | Disposition: A | Discharge status: Home / Self Care | Payer: Medicare Other | Source: Ambulatory Visit | Attending: Specialist | Admitting: Specialist

## 2016-05-28 DIAGNOSIS — E0590 Thyrotoxicosis, unspecified without thyrotoxic crisis or storm: Secondary | ICD-10-CM

## 2016-05-28 MED ORDER — SODIUM IODIDE I 131 CAPSULE
12.2500 | Freq: Once | INTRAVENOUS | Status: AC | PRN
  Administered 2016-05-28: 12.25 via ORAL

## 2016-05-29 ENCOUNTER — Encounter (HOSPITAL_COMMUNITY)
Admission: RE | Admit: 2016-05-29 | Discharge: 2016-05-29 | Disposition: A | Discharge status: Home / Self Care | Payer: Medicare Other | Source: Ambulatory Visit | Attending: Specialist | Admitting: Specialist

## 2016-05-29 DIAGNOSIS — E0590 Thyrotoxicosis, unspecified without thyrotoxic crisis or storm: Secondary | ICD-10-CM | POA: Diagnosis not present

## 2016-05-29 MED ORDER — SODIUM PERTECHNETATE TC 99M INJECTION
10.9300 | Freq: Once | INTRAVENOUS | Status: AC | PRN
  Administered 2016-05-29: 10.93 via INTRAVENOUS

## 2016-07-20 ENCOUNTER — Emergency Department (HOSPITAL_COMMUNITY): Payer: Medicare Other

## 2016-07-20 ENCOUNTER — Encounter (HOSPITAL_COMMUNITY): Payer: Self-pay | Admitting: Emergency Medicine

## 2016-07-20 ENCOUNTER — Observation Stay (HOSPITAL_COMMUNITY)
Admission: EM | Admit: 2016-07-20 | Discharge: 2016-07-22 | Disposition: A | Discharge status: Home / Self Care | Payer: Medicare Other | Attending: Internal Medicine | Admitting: Internal Medicine

## 2016-07-20 DIAGNOSIS — Z7982 Long term (current) use of aspirin: Secondary | ICD-10-CM | POA: Insufficient documentation

## 2016-07-20 DIAGNOSIS — K219 Gastro-esophageal reflux disease without esophagitis: Secondary | ICD-10-CM | POA: Diagnosis not present

## 2016-07-20 DIAGNOSIS — R0602 Shortness of breath: Secondary | ICD-10-CM | POA: Diagnosis present

## 2016-07-20 DIAGNOSIS — I10 Essential (primary) hypertension: Secondary | ICD-10-CM | POA: Diagnosis present

## 2016-07-20 DIAGNOSIS — Z79899 Other long term (current) drug therapy: Secondary | ICD-10-CM | POA: Diagnosis not present

## 2016-07-20 DIAGNOSIS — R079 Chest pain, unspecified: Secondary | ICD-10-CM

## 2016-07-20 DIAGNOSIS — E1142 Type 2 diabetes mellitus with diabetic polyneuropathy: Secondary | ICD-10-CM

## 2016-07-20 DIAGNOSIS — E1165 Type 2 diabetes mellitus with hyperglycemia: Secondary | ICD-10-CM | POA: Insufficient documentation

## 2016-07-20 DIAGNOSIS — R0789 Other chest pain: Secondary | ICD-10-CM | POA: Diagnosis not present

## 2016-07-20 DIAGNOSIS — Z6838 Body mass index (BMI) 38.0-38.9, adult: Secondary | ICD-10-CM | POA: Diagnosis not present

## 2016-07-20 DIAGNOSIS — R071 Chest pain on breathing: Secondary | ICD-10-CM

## 2016-07-20 DIAGNOSIS — Z7984 Long term (current) use of oral hypoglycemic drugs: Secondary | ICD-10-CM | POA: Diagnosis not present

## 2016-07-20 DIAGNOSIS — Z791 Long term (current) use of non-steroidal anti-inflammatories (NSAID): Secondary | ICD-10-CM | POA: Insufficient documentation

## 2016-07-20 DIAGNOSIS — E785 Hyperlipidemia, unspecified: Secondary | ICD-10-CM | POA: Diagnosis not present

## 2016-07-20 DIAGNOSIS — E119 Type 2 diabetes mellitus without complications: Secondary | ICD-10-CM

## 2016-07-20 DIAGNOSIS — R739 Hyperglycemia, unspecified: Secondary | ICD-10-CM | POA: Diagnosis not present

## 2016-07-20 DIAGNOSIS — F319 Bipolar disorder, unspecified: Secondary | ICD-10-CM | POA: Diagnosis not present

## 2016-07-20 LAB — BASIC METABOLIC PANEL
ANION GAP: 10 (ref 5–15)
BUN: 22 mg/dL — ABNORMAL HIGH (ref 6–20)
CHLORIDE: 100 mmol/L — AB (ref 101–111)
CO2: 22 mmol/L (ref 22–32)
Calcium: 9.4 mg/dL (ref 8.9–10.3)
Creatinine, Ser: 1.03 mg/dL (ref 0.61–1.24)
GFR calc non Af Amer: >60 mL/min (ref >60)
Glucose, Bld: 370 mg/dL — ABNORMAL HIGH (ref 65–99)
Potassium: 4.6 mmol/L (ref 3.5–5.1)
SODIUM: 132 mmol/L — AB (ref 135–145)

## 2016-07-20 LAB — CBC
HCT: 41.4 % (ref 39.0–52.0)
Hemoglobin: 14 g/dL (ref 13.0–17.0)
MCH: 19.4 pg — ABNORMAL LOW (ref 26.0–34.0)
MCHC: 33.8 g/dL (ref 30.0–36.0)
MCV: 57.3 fL — ABNORMAL LOW (ref 78.0–100.0)
Platelets: 287 10*3/uL (ref 150–400)
RBC: 7.23 MIL/uL — AB (ref 4.22–5.81)
RDW: 16.2 % — ABNORMAL HIGH (ref 11.5–15.5)
WBC: 7.3 10*3/uL (ref 4.0–10.5)

## 2016-07-20 LAB — I-STAT TROPONIN, ED: Troponin i, poc: 0 ng/mL (ref 0.00–0.08)

## 2016-07-20 MED ORDER — SODIUM CHLORIDE 0.9 % IV BOLUS (SEPSIS)
500.0000 mL | Freq: Once | INTRAVENOUS | Status: AC
  Administered 2016-07-20: 500 mL via INTRAVENOUS

## 2016-07-20 MED ORDER — IOPAMIDOL (ISOVUE-370) INJECTION 76%
100.0000 mL | Freq: Once | INTRAVENOUS | Status: AC | PRN
  Administered 2016-07-20: 100 mL via INTRAVENOUS

## 2016-07-20 NOTE — H&P (Signed)
Steve Pierce ZOX:[id number] DOB: Apr 19, 1971 DOA: 07/20/2016     PCP: [facility]  [facility]: [facility] Patient coming from: home Lives  With family    Chief Complaint: This of breath and chest discomfort  HPI: Steve Pierce is a 45 y.o. male with medical history significant of DM 2, HTN, obesity depression    Presented with some 24 hours of intermittent shortness of breath associated with chest pain. Chest pain last 5-10 min at a time feels sharp like a knife substernal. Nothing seems to make it better or worse non exertional.   Distal associated with diaphoresis. 2 weeks ago September 29 th patient undergone knee surgery for torn Meniscus. Chest pain currently has resolved. Pain did not radiate. Not associated nausea vomiting or diarrhea He has occasional loose BM but no diarrhea.    Regarding pertinent Chronic problems: Patient diabetes has been poorly controlled he reprots his blood sugar running on high side.  Patient has known history of bipolar disorder Last stress test was in August 2014 and was unremarkable patient was admitted that that time for chest pain evaluation image felt to be secondary to deconditioning   IN ER:  Temp (24hrs), Avg:98.8 F (37.1 C), Min:98.8 F (37.1 C), Max:98.8 F (37.1 C)    Satting 100% on room a heart rate 113 blood pressure 1 51/99 troponin 0.00  Sodium 132 glucose 370 cranial 1.03 WBC 7.3 hemoglobin 14  CTA of the chest no evidence of PE chest x-ray unremarkable Following Medications were ordered in ER: Medications  sodium chloride 0.9 % bolus 500 mL (500 mLs Intravenous New Bag/Given 07/20/16 2115)  iopamidol (ISOVUE-370) 76 % injection 100 mL (100 mLs Intravenous Contrast Given 07/20/16 2130)      Hospitalist was called for admission for chest pain evaluation  Review of Systems:    Pertinent positives include: chest pain, shortness of breath at rest. dyspnea on exertion,     Constitutional:  No weight loss, night sweats, Fevers, chills, fatigue, weight loss  HEENT:  No headaches, Difficulty swallowing,Tooth/dental problems,Sore throat,  No sneezing, itching, ear ache, nasal congestion, post nasal drip,  Cardio-vascular:  No  Orthopnea, PND, anasarca, dizziness, palpitations.no Bilateral lower extremity swelling  GI:  No heartburn, indigestion, abdominal pain, nausea, vomiting, diarrhea, change in bowel habits, loss of appetite, melena, blood in stool, hematemesis Resp:    No excess mucus, no productive cough, No non-productive cough, No coughing up of blood.No change in color of mucus.No wheezing. Skin:  no rash or lesions. No jaundice GU:  no dysuria, change in color of urine, no urgency or frequency. No straining to urinate.  No flank pain.  Musculoskeletal:  No joint pain or no joint swelling. No decreased range of motion. No back pain.  Psych:  No change in mood or affect. No depression or anxiety. No memory loss.  Neuro: no localizing neurological complaints, no tingling, no weakness, no double vision, no gait abnormality, no slurred speech, no confusion  As per HPI otherwise 10 point review of systems negative.   Past Medical History: Past Medical History:  Diagnosis Date  . Arthritis   . Depression   . Diabetes mellitus without complication (HCC)   . Hypertension   . 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 HERNIA REPAIR       Social History:  Ambulatory  Independently    reports that he has never smoked. He has never  used smokeless tobacco. He reports that he drinks alcohol. He reports that he does not use drugs.  Allergies:  No Known Allergies     Family History:  Family History  Problem Relation Age of Onset  . CAD Neg Hx     Medications: Prior to Admission medications   Medication Sig Start Date End Date Taking? Authorizing Provider  ABILIFY MAINTENA 400 MG SUSR Inject 1  each into the skin every 28 (twenty-eight) days. 06/09/16  Yes Historical Provider, MD  aspirin 325 MG tablet Take 325 mg by mouth daily.   Yes Historical Provider, MD  diclofenac (VOLTAREN) 75 MG EC tablet Take 75 mg by mouth 2 (two) times daily.   Yes Historical Provider, MD  furosemide (LASIX) 40 MG tablet Take 40 mg by mouth daily. 04/11/15  Yes Historical Provider, MD  gabapentin (NEURONTIN) 400 MG capsule Take 1 capsule (400 mg total) by mouth 4 (four) times daily. Patient taking differently: Take 400 mg by mouth 2 (two) times daily.  12/13/14  Yes Massie Maroon, FNP  glipiZIDE (GLUCOTROL) 5 MG tablet Take 1 tablet (5 mg total) by mouth every morning. 12/13/14  Yes Massie Maroon, FNP  LATUDA 40 MG TABS tablet Take 40 mg by mouth at bedtime.  03/18/15  Yes Historical Provider, MD  lisinopril (PRINIVIL,ZESTRIL) 20 MG tablet Take 1 tablet (20 mg total) by mouth daily. 12/13/14  Yes Massie Maroon, FNP  meloxicam (MOBIC) 7.5 MG tablet Take 2 tablets (15 mg total) by mouth daily. 02/24/16  Yes Joycie Peek, PA-C  methocarbamol (ROBAXIN) 750 MG tablet take 1 tablet by mouth twice a day if needed muscle spasm 04/11/15  Yes Historical Provider, MD  naproxen (NAPROSYN) 500 MG tablet Take 1 tablet (500 mg total) by mouth 2 (two) times daily. Patient taking differently: Take 500 mg by mouth 2 (two) times daily as needed for moderate pain.  04/12/15  Yes Rolland Porter, MD  oxyCODONE-acetaminophen (PERCOCET) 10-325 MG tablet Take 1 tablet by mouth at bedtime as needed for pain. 07/10/16  Yes Historical Provider, MD  sertraline (ZOLOFT) 50 MG tablet Take 50 mg by mouth daily. 05/26/16  Yes Historical Provider, MD  SYNJARDY 02-999 MG TABS Take 1 tablet by mouth daily. 06/09/16  Yes Historical Provider, MD  testosterone cypionate (DEPOTESTOSTERONE CYPIONATE) 200 MG/ML injection Inject 1 mL into the muscle every 7 (seven) days. 06/26/16  Yes Historical Provider, MD  topiramate (TOPAMAX) 100 MG tablet Take 100 mg by  mouth 2 (two) times daily as needed (headaches).  04/11/15  Yes Historical Provider, MD  traZODone (DESYREL) 50 MG tablet Take 1 tablet by mouth daily as needed for sleep.  06/09/16  Yes Historical Provider, MD  HYDROcodone-acetaminophen (NORCO/VICODIN) 5-325 MG per tablet Take 1 tablet by mouth every 4 (four) hours as needed. Patient not taking: Reported on 07/20/2016 04/12/15   Rolland Porter, MD  lithium carbonate 300 MG capsule Take 300 mg by mouth 2 (two) times daily with a meal.    Historical Provider, MD  metFORMIN (GLUCOPHAGE) 1000 MG tablet Take 1 tablet (1,000 mg total) by mouth 2 (two) times daily with a meal. Patient not taking: Reported on 04/12/2015 12/26/14   Massie Maroon, FNP  sitaGLIPtin (JANUVIA) 100 MG tablet Take 1 tablet (100 mg total) by mouth daily. Patient not taking: Reported on 04/12/2015 12/26/14   Massie Maroon, FNP    Physical Exam: Patient Vitals for the past 24 hrs:  BP Temp Temp src Pulse Resp SpO2  07/20/16 1921 150/99 98.8 F (37.1 C) Oral 113 20 100 %    1. General:  in No Acute distress 2. Psychological: Alert and   Oriented 3. Head/ENT:   Moist   Mucous Membranes                          Head Non traumatic, neck supple                          Normal   Dentition 4. SKIN:   decreased Skin turgor,  Skin clean Dry and intact no rash 5. Heart: Regular rate and rhythm no Murmur, Rub or gallop 6. Lungs: distant no wheezes or crackles   7. Abdomen: Soft, non-tender, Non distended obese 8. Lower extremities: no clubbing, cyanosis, or edema 9. Neurologically Grossly intact, moving all 4 extremities equally 10.  MSK: Normal range of motion right knee stitches in place   body mass index is unknown because there is no height or weight on file.  Labs on Admission:   Labs on Admission: I have personally reviewed following labs and imaging studies  CBC:  Recent Labs Lab 07/20/16 1933  WBC 7.3  HGB 14.0  HCT 41.4  MCV 57.3*  PLT 287   Basic Metabolic  Panel:  Recent Labs Lab 07/20/16 1933  NA 132*  K 4.6  CL 100*  CO2 22  GLUCOSE 370*  BUN 22*  CREATININE 1.03  CALCIUM 9.4   GFR: CrCl cannot be calculated (Unknown ideal weight.). Liver Function Tests: No results for input(s): AST, ALT, ALKPHOS, BILITOT, PROT, ALBUMIN in the last 168 hours. No results for input(s): LIPASE, AMYLASE in the last 168 hours. No results for input(s): AMMONIA in the last 168 hours. Coagulation Profile: No results for input(s): INR, PROTIME in the last 168 hours. Cardiac Enzymes: No results for input(s): CKTOTAL, CKMB, CKMBINDEX, TROPONINI in the last 168 hours. BNP (last 3 results) No results for input(s): PROBNP in the last 8760 hours. HbA1C: No results for input(s): HGBA1C in the last 72 hours. CBG: No results for input(s): GLUCAP in the last 168 hours. Lipid Profile: No results for input(s): CHOL, HDL, LDLCALC, TRIG, CHOLHDL, LDLDIRECT in the last 72 hours. Thyroid Function Tests: No results for input(s): TSH, T4TOTAL, FREET4, T3FREE, THYROIDAB in the last 72 hours. Anemia Panel: No results for input(s): VITAMINB12, FOLATE, FERRITIN, TIBC, IRON, RETICCTPCT in the last 72 hours.   @LABRCNTIP (procalcitonin:4,lacticidven:4) )No results found for this or any previous visit (from the past 240 hour(s)).    UA not ordered  Lab Results  Component Value Date   HGBA1C 10.9 (H) 12/12/2014    CrCl cannot be calculated (Unknown ideal weight.).  BNP (last 3 results) No results for input(s): PROBNP in the last 8760 hours.   ECG REPORT  Independently reviewed Rate: 113  Rhythm: Sinus tachycardia ST&T Change: T wave inversions nonspecific QTC  412  There were no vitals filed for this visit.   Cultures: No results found for: SDES, SPECREQUEST, CULT, REPTSTATUS   Radiological Exams on Admission: Dg Chest 2 View  Result Date: 07/20/2016 CLINICAL DATA:  Shortness of breath and chest discomfort for 1 day. EXAM: CHEST  2 VIEW COMPARISON:   CT chest and PA and lateral chest 04/12/2015. FINDINGS: Lungs are clear. Heart size is normal. No pneumothorax or pleural effusion. No bony abnormality. IMPRESSION: Negative chest. Electronically Signed   By: Drusilla Kanner M.D.  On: 07/20/2016 20:04   Ct Angio Chest Pe W And/or Wo Contrast  Result Date: 07/20/2016 CLINICAL DATA:  Shortness of breath and intermittent chest pain EXAM: CT ANGIOGRAPHY CHEST WITH CONTRAST TECHNIQUE: Multidetector CT imaging of the chest was performed using the standard protocol during bolus administration of intravenous contrast. Multiplanar CT image reconstructions and MIPs were obtained to evaluate the vascular anatomy. CONTRAST:  100 mL Isovue 370 intravenous COMPARISON:  Chest x-ray 07/20/2016, CT thorax 04/12/2015 FINDINGS: Cardiovascular: There is no evidence for filling defect within the central or segmental pulmonary arteries to suggest acute embolus. Thoracic aorta demonstrates normal caliber and no evidence for dissection. Heart size is nonenlarged. No pericardial effusion. Mediastinum/Nodes: No significantly enlarged mediastinal lymph nodes. 9 mm node in the right hilus. No significant axillary adenopathy. Trachea and mainstem bronchi within normal limits. Thyroid gland is unremarkable. Lungs/Pleura: No acute infiltrate, effusion, pneumothorax, or pulmonary nodule. Upper Abdomen: No acute abnormality. Musculoskeletal: No acute osseous abnormality Review of the MIP images confirms the above findings. IMPRESSION: Negative examination.  No CT evidence for acute pulmonary embolus. Electronically Signed   By: Jasmine PangKim  Fujinaga M.D.   On: 07/20/2016 22:15    Chart has been reviewed    Assessment/Plan  45 y.o. male with medical history significant of DM 2, HTN, obesity depression being admitted for an exertional chest pain evaluation currently resolved     Present on Admission: . HTN (hypertension) continue home medications . Chest pain - - given risk factors will  admit, monitor on telemetry, cycle cardiac enzymes, obtain serial ECG. Further risk stratify with lipid panel, hgA1C, obtain TSH. Make sure patient is on Aspirin. Further treatment based on the currently pending results.   . Hyperglycemia- order sliding scale, continue home medications given poor control ordered diabetes coordinator consult   Other plan as per orders.  DVT prophylaxis:     Lovenox     Code Status:  FULL CODE  as per patient    Family Communication:   Family at  Bedside  plan of care was discussed with  Wife Steve Pierce [phone]  Disposition Plan:      To home once workup is complete and patient is stable                                          Diabetes coordinator       consulted                          Consults called: cardiology emailed    Admission status:   obs   Level of care    tele      I have spent a total of 56 min on this admission    Steve Pierce 07/21/2016, 12:38 AM    [facility]  Pager [phone]   after 2 AM please page floor coverage PA If 7AM-7PM, please contact the day team taking care of the patient  [email or web address]  Password [email or web address]

## 2016-07-20 NOTE — ED Triage Notes (Addendum)
Pt states that he had SOB before he went to bed last night and has had intermittent CP today with it. Also felt sweaty earlier today. Pt also notes that he just had knee surgery approx. 2 weeks ago. Alert and oriented.

## 2016-07-20 NOTE — ED Provider Notes (Signed)
[facility]-EMERGENCY DEPT Provider Note   CSN: [id number] Arrival date & time: 07/20/16  1849     History   Chief Complaint Chief Complaint  Patient presents with  . Shortness of Breath  . Chest Pain    HPI Steve Pierce is a 45 y.o. male.  Patient complains of dyspnea on exertion. Also patient has had some chest pain today   The history is provided by the patient. No language interpreter was used.  Shortness of Breath  This is a new problem. The problem occurs frequently.The current episode started more than 1 week ago. The problem has not changed since onset.Associated symptoms include chest pain. Pertinent negatives include no fever, no headaches, no cough, no abdominal pain and no rash. The problem's precipitants include medical treatment. Risk factors include recent leg injury. He has tried nothing for the symptoms. He has had prior hospitalizations. He has had no prior ED visits. He has had no prior ICU admissions. Associated medical issues do not include asthma.  Chest Pain   Associated symptoms include shortness of breath. Pertinent negatives include no abdominal pain, no back pain, no cough, no fever and no headaches.  Pertinent negatives for past medical history include no seizures.    Past Medical History:  Diagnosis Date  . Arthritis   . Depression   . Diabetes mellitus without complication (HCC)   . Hypertension   . 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 Hyperglycemia 07/20/2016  . Depression, major 12/13/2014  . Depression 12/13/2014  . Frequent falls 12/13/2014  . History of hyperlipidemia 12/13/2014  . DM2 (diabetes mellitus, type 2) (HCC) 05/30/2013  . Chest pain 05/30/2013  . HTN (hypertension) 05/30/2013    Past Surgical History:  Procedure Laterality Date  . HERNIA REPAIR         Home Medications    Prior to Admission medications   Medication Sig Start Date End Date Taking? Authorizing Provider    ABILIFY MAINTENA 400 MG SUSR Inject 1 each into the skin every 28 (twenty-eight) days. 06/09/16  Yes Historical Provider, MD  aspirin 325 MG tablet Take 325 mg by mouth daily.   Yes Historical Provider, MD  diclofenac (VOLTAREN) 75 MG EC tablet Take 75 mg by mouth 2 (two) times daily.   Yes Historical Provider, MD  furosemide (LASIX) 40 MG tablet Take 40 mg by mouth daily. 04/11/15  Yes Historical Provider, MD  gabapentin (NEURONTIN) 400 MG capsule Take 1 capsule (400 mg total) by mouth 4 (four) times daily. Patient taking differently: Take 400 mg by mouth 2 (two) times daily.  12/13/14  Yes Massie MaroonLachina M Hollis, FNP  glipiZIDE (GLUCOTROL) 5 MG tablet Take 1 tablet (5 mg total) by mouth every morning. 12/13/14  Yes Massie MaroonLachina M Hollis, FNP  LATUDA 40 MG TABS tablet Take 40 mg by mouth at bedtime.  03/18/15  Yes Historical Provider, MD  lisinopril (PRINIVIL,ZESTRIL) 20 MG tablet Take 1 tablet (20 mg total) by mouth daily. 12/13/14  Yes Massie MaroonLachina M Hollis, FNP  meloxicam (MOBIC) 7.5 MG tablet Take 2 tablets (15 mg total) by mouth daily. 02/24/16  Yes Joycie PeekBenjamin Cartner, PA-C  methocarbamol (ROBAXIN) 750 MG tablet take 1 tablet by mouth twice a day if needed muscle spasm 04/11/15  Yes Historical Provider, MD  naproxen (NAPROSYN) 500 MG tablet Take 1 tablet (500 mg total) by mouth 2 (two) times daily. Patient taking differently: Take 500 mg by mouth 2 (two) times  daily as needed for moderate pain.  04/12/15  Yes Rolland Porter, MD  oxyCODONE-acetaminophen (PERCOCET) 10-325 MG tablet Take 1 tablet by mouth at bedtime as needed for pain. 07/10/16  Yes Historical Provider, MD  sertraline (ZOLOFT) 50 MG tablet Take 50 mg by mouth daily. 05/26/16  Yes Historical Provider, MD  SYNJARDY 02-999 MG TABS Take 1 tablet by mouth daily. 06/09/16  Yes Historical Provider, MD  testosterone cypionate (DEPOTESTOSTERONE CYPIONATE) 200 MG/ML injection Inject 1 mL into the muscle every 7 (seven) days. 06/26/16  Yes Historical Provider, MD  topiramate  (TOPAMAX) 100 MG tablet Take 100 mg by mouth 2 (two) times daily as needed (headaches).  04/11/15  Yes Historical Provider, MD  traZODone (DESYREL) 50 MG tablet Take 1 tablet by mouth daily as needed for sleep.  06/09/16  Yes Historical Provider, MD  HYDROcodone-acetaminophen (NORCO/VICODIN) 5-325 MG per tablet Take 1 tablet by mouth every 4 (four) hours as needed. Patient not taking: Reported on 07/20/2016 04/12/15   Rolland Porter, MD  lithium carbonate 300 MG capsule Take 300 mg by mouth 2 (two) times daily with a meal.    Historical Provider, MD  metFORMIN (GLUCOPHAGE) 1000 MG tablet Take 1 tablet (1,000 mg total) by mouth 2 (two) times daily with a meal. Patient not taking: Reported on 04/12/2015 12/26/14   Massie Maroon, FNP  sitaGLIPtin (JANUVIA) 100 MG tablet Take 1 tablet (100 mg total) by mouth daily. Patient not taking: Reported on 04/12/2015 12/26/14   Massie Maroon, FNP    Family History Family History  Problem Relation Age of Onset  . CAD Neg Hx     Social History Social History  Substance Use Topics  . Smoking status: Never Smoker  . Smokeless tobacco: Never Used  . Alcohol use Yes     Comment: occational beer or liquior      Allergies   Review of patient's allergies indicates no known allergies.   Review of Systems Review of Systems  Constitutional: Negative for appetite change, fatigue and fever.  HENT: Negative for congestion, ear discharge and sinus pressure.   Eyes: Negative for discharge.  Respiratory: Positive for shortness of breath. Negative for cough.   Cardiovascular: Positive for chest pain.  Gastrointestinal: Negative for abdominal pain and diarrhea.  Genitourinary: Negative for frequency and hematuria.  Musculoskeletal: Negative for back pain.  Skin: Negative for rash.  Neurological: Negative for seizures and headaches.  Psychiatric/Behavioral: Negative for hallucinations.     Physical Exam Updated Vital Signs BP 150/99 (BP Location: Right Arm)    Pulse 113   Temp 98.8 F (37.1 C) (Oral)   Resp 20   SpO2 100%   Physical Exam  Constitutional: He is oriented to person, place, and time. He appears well-developed.  HENT:  Head: Normocephalic.  Eyes: Conjunctivae and EOM are normal. No scleral icterus.  Neck: Neck supple. No thyromegaly present.  Cardiovascular: Normal rate and regular rhythm.  Exam reveals no gallop and no friction rub.   No murmur heard. Pulmonary/Chest: No stridor. He has no wheezes. He has no rales. He exhibits no tenderness.  Abdominal: He exhibits no distension. There is no tenderness. There is no rebound.  Musculoskeletal: Normal range of motion. He exhibits no edema.  Lymphadenopathy:    He has no cervical adenopathy.  Neurological: He is oriented to person, place, and time. He exhibits normal muscle tone. Coordination normal.  Skin: No rash noted. No erythema.  Psychiatric: He has a normal mood and affect. His behavior  is normal.     ED Treatments / Results  Labs (all labs ordered are listed, but only abnormal results are displayed) Labs Reviewed  BASIC METABOLIC PANEL - Abnormal; Notable for the following:       Result Value   Sodium 132 (*)    Chloride 100 (*)    Glucose, Bld 370 (*)    BUN 22 (*)    All other components within normal limits  CBC - Abnormal; Notable for the following:    RBC 7.23 (*)    MCV 57.3 (*)    MCH 19.4 (*)    RDW 16.2 (*)    All other components within normal limits  I-STAT TROPOININ, ED  I-STAT TROPOININ, ED    EKG  EKG Interpretation  Date/Time:  Monday July 20 2016 19:18:52 EDT Ventricular Rate:  113 PR Interval:    QRS Duration: 88 QT Interval:  300 QTC Calculation: 412 R Axis:   77 Text Interpretation:  Sinus tachycardia Ventricular premature complex Aberrant conduction of SV complex(es) Posterior infarct, old Nonspecific T abnormalities, lateral leads Baseline wander in lead(s) V2 since last tracing no significant change Confirmed by Effie Shy  MD,  ELLIOTT [phone]) on 07/20/2016 7:50:37 PM       Radiology Dg Chest 2 View  Result Date: 07/20/2016 CLINICAL DATA:  Shortness of breath and chest discomfort for 1 day. EXAM: CHEST  2 VIEW COMPARISON:  CT chest and PA and lateral chest 04/12/2015. FINDINGS: Lungs are clear. Heart size is normal. No pneumothorax or pleural effusion. No bony abnormality. IMPRESSION: Negative chest. Electronically Signed   By: Drusilla Kanner M.D.   On: 07/20/2016 20:04   Ct Angio Chest Pe W And/or Wo Contrast  Result Date: 07/20/2016 CLINICAL DATA:  Shortness of breath and intermittent chest pain EXAM: CT ANGIOGRAPHY CHEST WITH CONTRAST TECHNIQUE: Multidetector CT imaging of the chest was performed using the standard protocol during bolus administration of intravenous contrast. Multiplanar CT image reconstructions and MIPs were obtained to evaluate the vascular anatomy. CONTRAST:  100 mL Isovue 370 intravenous COMPARISON:  Chest x-ray 07/20/2016, CT thorax 04/12/2015 FINDINGS: Cardiovascular: There is no evidence for filling defect within the central or segmental pulmonary arteries to suggest acute embolus. Thoracic aorta demonstrates normal caliber and no evidence for dissection. Heart size is nonenlarged. No pericardial effusion. Mediastinum/Nodes: No significantly enlarged mediastinal lymph nodes. 9 mm node in the right hilus. No significant axillary adenopathy. Trachea and mainstem bronchi within normal limits. Thyroid gland is unremarkable. Lungs/Pleura: No acute infiltrate, effusion, pneumothorax, or pulmonary nodule. Upper Abdomen: No acute abnormality. Musculoskeletal: No acute osseous abnormality Review of the MIP images confirms the above findings. IMPRESSION: Negative examination.  No CT evidence for acute pulmonary embolus. Electronically Signed   By: Jasmine Pang M.D.   On: 07/20/2016 22:15    Procedures Procedures (including critical care time)  Medications Ordered in ED Medications  sodium chloride 0.9  % bolus 500 mL (500 mLs Intravenous New Bag/Given 07/20/16 2115)  iopamidol (ISOVUE-370) 76 % injection 100 mL (100 mLs Intravenous Contrast Given 07/20/16 2130)     Initial Impression / Assessment and Plan / ED Course  I have reviewed the triage vital signs and the nursing notes.  Pertinent labs & imaging results that were available during my care of the patient were reviewed by me and considered in my medical decision making (see chart for details).  Clinical Course    Patient with chest pain and dyspnea on exertion. He'll be admitted for chest  pain rule out  Final Clinical Impressions(s) / ED Diagnoses   Final diagnoses:  None    New Prescriptions New Prescriptions   No medications on file     Bethann Berkshire, MD 07/20/16 2303

## 2016-07-21 ENCOUNTER — Observation Stay (HOSPITAL_COMMUNITY): Payer: Medicare Other

## 2016-07-21 DIAGNOSIS — E1165 Type 2 diabetes mellitus with hyperglycemia: Secondary | ICD-10-CM | POA: Diagnosis not present

## 2016-07-21 DIAGNOSIS — E785 Hyperlipidemia, unspecified: Secondary | ICD-10-CM

## 2016-07-21 DIAGNOSIS — R079 Chest pain, unspecified: Secondary | ICD-10-CM | POA: Diagnosis not present

## 2016-07-21 DIAGNOSIS — I10 Essential (primary) hypertension: Secondary | ICD-10-CM | POA: Diagnosis not present

## 2016-07-21 DIAGNOSIS — R0602 Shortness of breath: Secondary | ICD-10-CM | POA: Diagnosis not present

## 2016-07-21 DIAGNOSIS — R0789 Other chest pain: Secondary | ICD-10-CM | POA: Diagnosis not present

## 2016-07-21 DIAGNOSIS — K219 Gastro-esophageal reflux disease without esophagitis: Secondary | ICD-10-CM

## 2016-07-21 LAB — LIPID PANEL
Cholesterol: 173 mg/dL (ref 0–200)
HDL: 41 mg/dL (ref >40)
LDL CALC: 101 mg/dL — AB (ref 0–99)
Total CHOL/HDL Ratio: 4.2 RATIO
Triglycerides: 153 mg/dL — ABNORMAL HIGH (ref <150)
VLDL: 31 mg/dL (ref 0–40)

## 2016-07-21 LAB — ECHOCARDIOGRAM COMPLETE
CHL CUP MV DEC (S): 232
E decel time: 232 msec
E/e' ratio: 9.72
FS: 28 % (ref 28–44)
HEIGHTINCHES: 71 in
IV/PV OW: 1
LA diam index: 1.4 cm/m2
LA vol: 42.5 mL
LASIZE: 34 mm
LAVOLA4C: 33.5 mL
LAVOLIN: 17.6 mL/m2
LEFT ATRIUM END SYS DIAM: 34 mm
LV TDI E'MEDIAL: 5.66
LVEEAVG: 9.72
LVEEMED: 9.72
LVELAT: 7.18 cm/s
LVOT area: 3.46 cm2
LVOT diameter: 21 mm
MV pk E vel: 69.8 m/s
MVPKAVEL: 61.1 m/s
PW: 16.8 mm — AB (ref 0.6–1.1)
TDI e' lateral: 7.18
Weight: 4411.2 oz

## 2016-07-21 LAB — GLUCOSE, CAPILLARY
GLUCOSE-CAPILLARY: 209 mg/dL — AB (ref 65–99)
GLUCOSE-CAPILLARY: 254 mg/dL — AB (ref 65–99)
GLUCOSE-CAPILLARY: 264 mg/dL — AB (ref 65–99)
Glucose-Capillary: 272 mg/dL — ABNORMAL HIGH (ref 65–99)
Glucose-Capillary: 199 mg/dL — ABNORMAL HIGH (ref 65–99)

## 2016-07-21 LAB — COMPREHENSIVE METABOLIC PANEL
ALK PHOS: 121 U/L (ref 38–126)
ALT: 31 U/L (ref 17–63)
ANION GAP: 9 (ref 5–15)
AST: 30 U/L (ref 15–41)
Albumin: 3.9 g/dL (ref 3.5–5.0)
BUN: 18 mg/dL (ref 6–20)
CALCIUM: 9 mg/dL (ref 8.9–10.3)
CO2: 22 mmol/L (ref 22–32)
CREATININE: 0.88 mg/dL (ref 0.61–1.24)
Chloride: 103 mmol/L (ref 101–111)
Glucose, Bld: 279 mg/dL — ABNORMAL HIGH (ref 65–99)
Potassium: 3.8 mmol/L (ref 3.5–5.1)
SODIUM: 134 mmol/L — AB (ref 135–145)
Total Bilirubin: 0.7 mg/dL (ref 0.3–1.2)
Total Protein: 7.5 g/dL (ref 6.5–8.1)

## 2016-07-21 LAB — CBC
HCT: 40.2 % (ref 39.0–52.0)
HEMOGLOBIN: 13 g/dL (ref 13.0–17.0)
MCH: 19.1 pg — ABNORMAL LOW (ref 26.0–34.0)
MCHC: 32.3 g/dL (ref 30.0–36.0)
MCV: 58.9 fL — ABNORMAL LOW (ref 78.0–100.0)
PLATELETS: 265 10*3/uL (ref 150–400)
RBC: 6.82 MIL/uL — AB (ref 4.22–5.81)
RDW: 16.4 % — ABNORMAL HIGH (ref 11.5–15.5)
WBC: 6.7 10*3/uL (ref 4.0–10.5)

## 2016-07-21 LAB — MAGNESIUM: MAGNESIUM: 1.8 mg/dL (ref 1.7–2.4)

## 2016-07-21 LAB — TSH: TSH: 0.995 u[IU]/mL (ref 0.350–4.500)

## 2016-07-21 LAB — TROPONIN I

## 2016-07-21 LAB — PHOSPHORUS: Phosphorus: 3.3 mg/dL (ref 2.5–4.6)

## 2016-07-21 MED ORDER — ALBUTEROL SULFATE (2.5 MG/3ML) 0.083% IN NEBU: 2.5000 mg | INHALATION_SOLUTION | RESPIRATORY_TRACT | Status: DC | PRN

## 2016-07-21 MED ORDER — ACETAMINOPHEN 650 MG RE SUPP: 650.0000 mg | Freq: Four times a day (QID) | RECTAL | Status: DC | PRN

## 2016-07-21 MED ORDER — SERTRALINE HCL 50 MG PO TABS
50.0000 mg | ORAL_TABLET | Freq: Every day | ORAL | Status: DC
  Administered 2016-07-21 – 2016-07-22 (×2): 50 mg via ORAL
  Filled 2016-07-21 (×2): qty 1

## 2016-07-21 MED ORDER — INSULIN ASPART 100 UNIT/ML ~~LOC~~ SOLN
0.0000 [IU] | Freq: Three times a day (TID) | SUBCUTANEOUS | Status: DC
  Administered 2016-07-21: 5 [IU] via SUBCUTANEOUS
  Administered 2016-07-21 – 2016-07-22 (×3): 8 [IU] via SUBCUTANEOUS
  Administered 2016-07-22: 5 [IU] via SUBCUTANEOUS
  Administered 2016-07-22: 8 [IU] via SUBCUTANEOUS

## 2016-07-21 MED ORDER — LURASIDONE HCL 40 MG PO TABS
40.0000 mg | ORAL_TABLET | Freq: Every day | ORAL | Status: DC
  Administered 2016-07-21 (×2): 40 mg via ORAL
  Filled 2016-07-21 (×3): qty 1

## 2016-07-21 MED ORDER — FUROSEMIDE 40 MG PO TABS
40.0000 mg | ORAL_TABLET | Freq: Every day | ORAL | Status: DC
  Administered 2016-07-21 – 2016-07-22 (×2): 40 mg via ORAL
  Filled 2016-07-21 (×2): qty 1

## 2016-07-21 MED ORDER — OXYCODONE HCL 5 MG PO TABS: 5.0000 mg | ORAL_TABLET | Freq: Four times a day (QID) | ORAL | Status: DC | PRN

## 2016-07-21 MED ORDER — GLIPIZIDE 10 MG PO TABS
5.0000 mg | ORAL_TABLET | Freq: Every morning | ORAL | Status: DC
  Administered 2016-07-21 – 2016-07-22 (×2): 5 mg via ORAL
  Filled 2016-07-21 (×2): qty 1

## 2016-07-21 MED ORDER — GABAPENTIN 400 MG PO CAPS
400.0000 mg | ORAL_CAPSULE | Freq: Two times a day (BID) | ORAL | Status: DC
  Administered 2016-07-21 – 2016-07-22 (×4): 400 mg via ORAL
  Filled 2016-07-21 (×4): qty 1

## 2016-07-21 MED ORDER — HYDROCODONE-ACETAMINOPHEN 5-325 MG PO TABS
1.0000 | ORAL_TABLET | ORAL | Status: DC | PRN
Start: 2016-07-21 — End: 2016-07-22

## 2016-07-21 MED ORDER — OXYCODONE-ACETAMINOPHEN 5-325 MG PO TABS: 1.0000 | ORAL_TABLET | Freq: Four times a day (QID) | ORAL | Status: DC | PRN

## 2016-07-21 MED ORDER — OXYCODONE-ACETAMINOPHEN 10-325 MG PO TABS: 1.0000 | ORAL_TABLET | Freq: Four times a day (QID) | ORAL | Status: DC | PRN

## 2016-07-21 MED ORDER — ATORVASTATIN CALCIUM 40 MG PO TABS
40.0000 mg | ORAL_TABLET | Freq: Every day | ORAL | Status: DC
  Administered 2016-07-21 – 2016-07-22 (×2): 40 mg via ORAL
  Filled 2016-07-21 (×2): qty 1

## 2016-07-21 MED ORDER — ENOXAPARIN SODIUM 60 MG/0.6ML ~~LOC~~ SOLN
60.0000 mg | Freq: Every day | SUBCUTANEOUS | Status: DC
  Administered 2016-07-21 – 2016-07-22 (×2): 60 mg via SUBCUTANEOUS
  Filled 2016-07-21 (×2): qty 0.6

## 2016-07-21 MED ORDER — ONDANSETRON HCL 4 MG/2ML IJ SOLN: 4.0000 mg | Freq: Four times a day (QID) | INTRAMUSCULAR | Status: DC | PRN

## 2016-07-21 MED ORDER — SODIUM CHLORIDE 0.9% FLUSH
3.0000 mL | Freq: Two times a day (BID) | INTRAVENOUS | Status: DC
  Administered 2016-07-21 – 2016-07-22 (×4): 3 mL via INTRAVENOUS

## 2016-07-21 MED ORDER — ACETAMINOPHEN 325 MG PO TABS: 650.0000 mg | ORAL_TABLET | Freq: Four times a day (QID) | ORAL | Status: DC | PRN

## 2016-07-21 MED ORDER — ONDANSETRON HCL 4 MG PO TABS: 4.0000 mg | ORAL_TABLET | Freq: Four times a day (QID) | ORAL | Status: DC | PRN

## 2016-07-21 MED ORDER — LIVING WELL WITH DIABETES BOOK
Freq: Once | Status: AC
  Administered 2016-07-21: 13:00:00
  Filled 2016-07-21: qty 1

## 2016-07-21 MED ORDER — LITHIUM CARBONATE 300 MG PO CAPS
300.0000 mg | ORAL_CAPSULE | Freq: Two times a day (BID) | ORAL | Status: DC
Start: 2016-07-21 — End: 2016-07-22
  Administered 2016-07-21 – 2016-07-22 (×4): 300 mg via ORAL
  Filled 2016-07-21 (×4): qty 1

## 2016-07-21 MED ORDER — ASPIRIN 325 MG PO TABS
325.0000 mg | ORAL_TABLET | Freq: Every day | ORAL | Status: DC
  Administered 2016-07-21 – 2016-07-22 (×2): 325 mg via ORAL
  Filled 2016-07-21 (×2): qty 1

## 2016-07-21 MED ORDER — LISINOPRIL 20 MG PO TABS
20.0000 mg | ORAL_TABLET | Freq: Every day | ORAL | Status: DC
  Administered 2016-07-21 – 2016-07-22 (×2): 20 mg via ORAL
  Filled 2016-07-21 (×2): qty 1

## 2016-07-21 MED ORDER — INSULIN DETEMIR 100 UNIT/ML ~~LOC~~ SOLN
12.0000 [IU] | Freq: Every day | SUBCUTANEOUS | Status: DC
  Administered 2016-07-21: 12 [IU] via SUBCUTANEOUS
  Filled 2016-07-21: qty 0.12

## 2016-07-21 MED ORDER — PANTOPRAZOLE SODIUM 40 MG PO TBEC
40.0000 mg | DELAYED_RELEASE_TABLET | Freq: Every day | ORAL | Status: DC
  Administered 2016-07-21 – 2016-07-22 (×2): 40 mg via ORAL
  Filled 2016-07-21 (×2): qty 1

## 2016-07-21 MED ORDER — INSULIN DETEMIR 100 UNIT/ML ~~LOC~~ SOLN
16.0000 [IU] | Freq: Every day | SUBCUTANEOUS | Status: DC
  Administered 2016-07-22: 16 [IU] via SUBCUTANEOUS
  Filled 2016-07-21: qty 0.16

## 2016-07-21 MED ORDER — METHOCARBAMOL 500 MG PO TABS: 750.0000 mg | ORAL_TABLET | Freq: Four times a day (QID) | ORAL | Status: DC | PRN

## 2016-07-21 MED ORDER — INSULIN ASPART 100 UNIT/ML ~~LOC~~ SOLN
0.0000 [IU] | Freq: Every day | SUBCUTANEOUS | Status: DC
  Administered 2016-07-21: 3 [IU] via SUBCUTANEOUS

## 2016-07-21 MED ORDER — MELOXICAM 15 MG PO TABS
15.0000 mg | ORAL_TABLET | Freq: Every day | ORAL | Status: DC
  Administered 2016-07-21 – 2016-07-22 (×2): 15 mg via ORAL
  Filled 2016-07-21 (×2): qty 1

## 2016-07-21 MED ORDER — TOPIRAMATE 100 MG PO TABS: 100.0000 mg | ORAL_TABLET | Freq: Two times a day (BID) | ORAL | Status: DC | PRN

## 2016-07-21 MED ORDER — METOPROLOL TARTRATE 25 MG PO TABS
12.5000 mg | ORAL_TABLET | Freq: Two times a day (BID) | ORAL | Status: DC
  Administered 2016-07-21 – 2016-07-22 (×3): 12.5 mg via ORAL
  Filled 2016-07-21 (×3): qty 1

## 2016-07-21 MED ORDER — TRAZODONE HCL 50 MG PO TABS: 50.0000 mg | ORAL_TABLET | Freq: Every evening | ORAL | Status: DC | PRN

## 2016-07-21 MED ORDER — ENOXAPARIN SODIUM 40 MG/0.4ML ~~LOC~~ SOLN: 40.0000 mg | SUBCUTANEOUS | Status: DC

## 2016-07-21 NOTE — Progress Notes (Signed)
  Echocardiogram 2D Echocardiogram has been performed.  Chantell Kunkler 07/21/2016, 9:57 AM

## 2016-07-21 NOTE — Progress Notes (Signed)
[facility] PROGRESS NOTE  Steve GathersJames Pierce BJY:[id number] DOB: 1970/11/25 DOA: 07/20/2016 PCP: [facility]  Interim summary and HPI 45 year old male with past medical history significant for hypertension, diabetes mellitus, morbid obesity, depression, degenerative joint disease and status post recent right knee surgery for meniscus tear; who was admitted on 07-20-16 due to retrosternal chest pain, with associated SOB. Patient with heart score of 4-5.  Planning nuclear stress test on 06-22-16; cardiology consulted.  Assessment/Plan: 1-CP: with heart score 4-5  -neg troponin and no ischemic changes appreciated on EKG or telemetry -will continue aspirin, b-blocker and ACE -will also start him on statins -given multiple risk factors will check nuclear stress test as inpatient -cardiology on board, will follow rec's -also started on PPI -NPO after midnight for test in am  2-uncontrolled diabetes: on oral regimen at home -A1C pending -but per patient his CBG's at home in 300 range almost all the time -holding metfornin while inpatient -will continue SSI and levemir -diabetes coordinator consulted -modified carb diet ordered  3-HTN: -will continue ACE and started on metoprolol -will monitor and adjust antihypertensive regimen as needed  -continue heart healthy diet   4-depression: -no SI or hallucinations -will continue home antidepressant regimen   5-obesity -Body mass index is 38.45 kg/m. -low calorie diet and exercise discussed with patient  6-GERD: -will treat with PPI -patients obesity increasing risk for reflux symptoms  7-HLD: -LDL goal is 70 or less -will continue statins started on this admission  Code Status: Full Family Communication: wife at bedside  Disposition Plan: anticipate home when medically stable; if everything stable on nuclear stress test, will be able to go back home tomorrow.  Consultants:  Cardiology (Dr. Sharyn LullHarwani)    Procedures:  Nuclear stress test is planned for 07/22/16  Antibiotics:  None   HPI/Subjective: Afebrile, currently CP free and no complaining of SOB or orthopnea.  Objective: Vitals:   07/21/16 0534 07/21/16 1427  BP: (!) 143/82 133/76  Pulse: 84 93  Resp: 18 18  Temp: 98.6 F (37 C) 99 F (37.2 C)    Intake/Output Summary (Last 24 hours) at 07/21/16 1549 Last data filed at 07/21/16 0900  Gross per 24 hour  Intake              740 ml  Output             1125 ml  Net             -385 ml   Filed Weights   07/21/16 0014  Weight: 125.1 kg (275 lb 11.2 oz)    Exam:  General: afebrile, reports no CP currently. Patient endorses no nausea, no vomiting, no orthopnea.  Cardiovascular: S1 and S2, no rubs, no gallops, soft SEM, no JVD  Respiratory: good air movement, no wheezing, no crackles Extremities: no edema, no cyanosis  Neurologic exam: no focal deficit appreciated.  Data Reviewed: Basic Metabolic Panel:  Recent Labs Lab 07/20/16 1933 07/21/16 0125  NA 132* 134*  K 4.6 3.8  CL 100* 103  CO2 22 22  GLUCOSE 370* 279*  BUN 22* 18  CREATININE 1.03 0.88  CALCIUM 9.4 9.0  MG  --  1.8  PHOS  --  3.3   Liver Function Tests:  Recent Labs Lab 07/21/16 0125  AST 30  ALT 31  ALKPHOS 121  BILITOT 0.7  PROT 7.5  ALBUMIN 3.9   CBC:  Recent Labs Lab 07/20/16 1933 07/21/16 0125  WBC 7.3 6.7  HGB 14.0  13.0  HCT 41.4 40.2  MCV 57.3* 58.9*  PLT 287 265   Cardiac Enzymes:  Recent Labs Lab 07/21/16 0125 07/21/16 0616 07/21/16 1217  TROPONINI <0.03 <0.03 <0.03   CBG:  Recent Labs Lab 07/21/16 0055 07/21/16 0821 07/21/16 1156  GLUCAP 264* 272* 254*   Studies: Dg Chest 2 View  Result Date: 07/20/2016 CLINICAL DATA:  Shortness of breath and chest discomfort for 1 day. EXAM: CHEST  2 VIEW COMPARISON:  CT chest and PA and lateral chest 04/12/2015. FINDINGS: Lungs are clear. Heart size is normal. No pneumothorax or pleural effusion. No bony  abnormality. IMPRESSION: Negative chest. Electronically Signed   By: Steve Pierce M.D.   On: 07/20/2016 20:04   Ct Angio Chest Pe W And/or Wo Contrast  Result Date: 07/20/2016 CLINICAL DATA:  Shortness of breath and intermittent chest pain EXAM: CT ANGIOGRAPHY CHEST WITH CONTRAST TECHNIQUE: Multidetector CT imaging of the chest was performed using the standard protocol during bolus administration of intravenous contrast. Multiplanar CT image reconstructions and MIPs were obtained to evaluate the vascular anatomy. CONTRAST:  100 mL Isovue 370 intravenous COMPARISON:  Chest x-ray 07/20/2016, CT thorax 04/12/2015 FINDINGS: Cardiovascular: There is no evidence for filling defect within the central or segmental pulmonary arteries to suggest acute embolus. Thoracic aorta demonstrates normal caliber and no evidence for dissection. Heart size is nonenlarged. No pericardial effusion. Mediastinum/Nodes: No significantly enlarged mediastinal lymph nodes. 9 mm node in the right hilus. No significant axillary adenopathy. Trachea and mainstem bronchi within normal limits. Thyroid gland is unremarkable. Lungs/Pleura: No acute infiltrate, effusion, pneumothorax, or pulmonary nodule. Upper Abdomen: No acute abnormality. Musculoskeletal: No acute osseous abnormality Review of the MIP images confirms the above findings. IMPRESSION: Negative examination.  No CT evidence for acute pulmonary embolus. Electronically Signed   By: Steve Pierce M.D.   On: 07/20/2016 22:15    Scheduled Meds: . aspirin  325 mg Oral Daily  . atorvastatin  40 mg Oral q1800  . enoxaparin (LOVENOX) injection  60 mg Subcutaneous Daily  . furosemide  40 mg Oral Daily  . gabapentin  400 mg Oral BID  . glipiZIDE  5 mg Oral q morning - 10a  . insulin aspart  0-15 Units Subcutaneous TID WC  . insulin aspart  0-5 Units Subcutaneous QHS  . [START ON 07/22/2016] insulin detemir  16 Units Subcutaneous Daily  . lisinopril  20 mg Oral Daily  . lithium  carbonate  300 mg Oral BID WC  . lurasidone  40 mg Oral QHS  . meloxicam  15 mg Oral Daily  . metoprolol tartrate  12.5 mg Oral BID  . pantoprazole  40 mg Oral Q1200  . sertraline  50 mg Oral Daily  . sodium chloride flush  3 mL Intravenous Q12H   Continuous Infusions:   Active Problems:   DM2 (diabetes mellitus, type 2) (HCC)   Chest pain   HTN (hypertension)   Hyperglycemia    Time spent: 30 minutes    Vassie Loll  [facility] Pager [phone]. If 7PM-7AM, please contact night-coverage at [email or web address], password [facility] 07/21/2016, 3:49 PM  LOS: 0 days

## 2016-07-21 NOTE — Progress Notes (Signed)
Rx Brief note:  Lovenox  Wt=125 kg, BMI=38, CrCl~119 ml/min  Rx adjusted Lovenox to 60mg  daily (~0.5 mg/kg) in pt with BMI>30  Thanks Lorenza EvangelistGreen, Maye Parkinson R 07/21/2016 1:03 AM

## 2016-07-21 NOTE — Consult Note (Signed)
Reason for Consult: Chest pain Referring Physician: [facility]  Demarian Epps is an 45 y.o. male.  HPI: Patient is 45 year old male with past medical history significant for hypertension, diabetes mellitus, morbid obesity, depression, degenerative joint disease, status post recent right knee surgery for meniscus tear was admitted yesterday because of retrosternal chest pain localized lasting 5-10 minutes around 9 AM associated with diaphoresis. Patient states the day prior 7 onset of shortness of breath which woke him up. Did not seek any medical attention again had chest pain yesterday evenings or so decided to come to the ED. EKG done in the ER showed normal sinus rhythm and nonspecific T-wave changes 3 sets of troponin I are negative. Patient denies relation of chest pain to food breathing or movement. Denies any cough fever or chills.  Past Medical History:  Diagnosis Date  . Arthritis   . Depression   . Diabetes mellitus without complication ([place])   . Hypertension   . 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 HERNIA REPAIR      Family History  Problem Relation Age of Onset  . Hypertension Mother   . Diabetes Mother   . Hypertension Father   . Diabetes Father   . Ovarian cancer Daughter   . CAD Neg Hx   . Stroke Neg Hx     Social History:  reports that he has never smoked. He has never used smokeless tobacco. He reports that he drinks alcohol. He reports that he does not use drugs.  Allergies: No Known Allergies  Medications: I have reviewed the patient's current medications.  Results for orders placed or performed during the hospital encounter of 07/20/16 (from the past 48 hour(s))  Basic metabolic panel     Status: Abnormal   Collection Time: 07/20/16  7:33 PM  Result Value Ref Range   Sodium 132 (L) 135 - 145 mmol/L   Potassium 4.6 3.5 - 5.1 mmol/L   Chloride 100 (L) 101 - 111 mmol/L   CO2 22 22 - 32 mmol/L   Glucose,  Bld 370 (H) 65 - 99 mg/dL   BUN 22 (H) 6 - 20 mg/dL   Creatinine, Ser 1.03 0.61 - 1.24 mg/dL   Calcium 9.4 8.9 - 10.3 mg/dL   GFR calc non Af Amer >60 >60 mL/min   GFR calc Af Amer >60 >60 mL/min    Comment: (NOTE) The eGFR has been calculated using the CKD EPI equation. This calculation has not been validated in all clinical situations. eGFR's persistently <60 mL/min signify possible Chronic Kidney Disease.    Anion gap 10 5 - 15  CBC     Status: Abnormal   Collection Time: 07/20/16  7:33 PM  Result Value Ref Range   WBC 7.3 4.0 - 10.5 K/uL   RBC 7.23 (H) 4.22 - 5.81 MIL/uL   Hemoglobin 14.0 13.0 - 17.0 g/dL   HCT 41.4 39.0 - 52.0 %   MCV 57.3 (L) 78.0 - 100.0 fL   MCH 19.4 (L) 26.0 - 34.0 pg   MCHC 33.8 30.0 - 36.0 g/dL   RDW 16.2 (H) 11.5 - 15.5 %   Platelets 287 150 - 400 K/uL  I-stat troponin, ED     Status: None   Collection Time: 07/20/16  7:42 PM  Result Value Ref Range   Troponin i, poc 0.00 0.00 - 0.08 ng/mL   Comment 3  Comment: Due to the release kinetics of cTnI, a negative result within the first hours of the onset of symptoms does not rule out myocardial infarction with certainty. If myocardial infarction is still suspected, repeat the test at appropriate intervals.   Glucose, capillary     Status: Abnormal   Collection Time: 07/21/16 12:55 AM  Result Value Ref Range   Glucose-Capillary 264 (H) 65 - 99 mg/dL  Magnesium     Status: None   Collection Time: 07/21/16  1:25 AM  Result Value Ref Range   Magnesium 1.8 1.7 - 2.4 mg/dL  Phosphorus     Status: None   Collection Time: 07/21/16  1:25 AM  Result Value Ref Range   Phosphorus 3.3 2.5 - 4.6 mg/dL  TSH     Status: None   Collection Time: 07/21/16  1:25 AM  Result Value Ref Range   TSH 0.995 0.350 - 4.500 uIU/mL    Comment: Performed by a 3rd Generation assay with a functional sensitivity of <=0.01 uIU/mL.  Comprehensive metabolic panel     Status: Abnormal   Collection Time: 07/21/16   1:25 AM  Result Value Ref Range   Sodium 134 (L) 135 - 145 mmol/L   Potassium 3.8 3.5 - 5.1 mmol/L    Comment: RESULT REPEATED AND VERIFIED DELTA CHECK NOTED    Chloride 103 101 - 111 mmol/L   CO2 22 22 - 32 mmol/L   Glucose, Bld 279 (H) 65 - 99 mg/dL   BUN 18 6 - 20 mg/dL   Creatinine, Ser 0.88 0.61 - 1.24 mg/dL   Calcium 9.0 8.9 - 10.3 mg/dL   Total Protein 7.5 6.5 - 8.1 g/dL   Albumin 3.9 3.5 - 5.0 g/dL   AST 30 15 - 41 U/L   ALT 31 17 - 63 U/L   Alkaline Phosphatase 121 38 - 126 U/L   Total Bilirubin 0.7 0.3 - 1.2 mg/dL   GFR calc non Af Amer >60 >60 mL/min   GFR calc Af Amer >60 >60 mL/min    Comment: (NOTE) The eGFR has been calculated using the CKD EPI equation. This calculation has not been validated in all clinical situations. eGFR's persistently <60 mL/min signify possible Chronic Kidney Disease.    Anion gap 9 5 - 15  CBC     Status: Abnormal   Collection Time: 07/21/16  1:25 AM  Result Value Ref Range   WBC 6.7 4.0 - 10.5 K/uL   RBC 6.82 (H) 4.22 - 5.81 MIL/uL   Hemoglobin 13.0 13.0 - 17.0 g/dL   HCT 40.2 39.0 - 52.0 %   MCV 58.9 (L) 78.0 - 100.0 fL   MCH 19.1 (L) 26.0 - 34.0 pg   MCHC 32.3 30.0 - 36.0 g/dL   RDW 16.4 (H) 11.5 - 15.5 %   Platelets 265 150 - 400 K/uL  Troponin I     Status: None   Collection Time: 07/21/16  1:25 AM  Result Value Ref Range   Troponin I <0.03 <0.03 ng/mL  Troponin I     Status: None   Collection Time: 07/21/16  6:16 AM  Result Value Ref Range   Troponin I <0.03 <0.03 ng/mL  Lipid panel     Status: Abnormal   Collection Time: 07/21/16  6:16 AM  Result Value Ref Range   Cholesterol 173 0 - 200 mg/dL   Triglycerides 153 (H) <150 mg/dL   HDL 41 >40 mg/dL   Total CHOL/HDL Ratio 4.2 RATIO   VLDL  31 0 - 40 mg/dL   LDL Cholesterol 101 (H) 0 - 99 mg/dL    Comment:        Total Cholesterol/HDL:CHD Risk Coronary Heart Disease Risk Table                     Men   Women  1/2 Average Risk   3.4   3.3  Average Risk       5.0    4.4  2 X Average Risk   9.6   7.1  3 X Average Risk  23.4   11.0        Use the calculated Patient Ratio above and the CHD Risk Table to determine the patient's CHD Risk.        ATP III CLASSIFICATION (LDL):  <100     mg/dL   Optimal  100-129  mg/dL   Near or Above                    Optimal  130-159  mg/dL   Borderline  160-189  mg/dL   High  >190     mg/dL   Very High Performed at [facility]   Glucose, capillary     Status: Abnormal   Collection Time: 07/21/16  8:21 AM  Result Value Ref Range   Glucose-Capillary 272 (H) 65 - 99 mg/dL    Dg Chest 2 View  Result Date: 07/20/2016 CLINICAL DATA:  Shortness of breath and chest discomfort for 1 day. EXAM: CHEST  2 VIEW COMPARISON:  CT chest and PA and lateral chest 04/12/2015. FINDINGS: Lungs are clear. Heart size is normal. No pneumothorax or pleural effusion. No bony abnormality. IMPRESSION: Negative chest. Electronically Signed   By: Inge Rise M.D.   On: 07/20/2016 20:04   Ct Angio Chest Pe W And/or Wo Contrast  Result Date: 07/20/2016 CLINICAL DATA:  Shortness of breath and intermittent chest pain EXAM: CT ANGIOGRAPHY CHEST WITH CONTRAST TECHNIQUE: Multidetector CT imaging of the chest was performed using the standard protocol during bolus administration of intravenous contrast. Multiplanar CT image reconstructions and MIPs were obtained to evaluate the vascular anatomy. CONTRAST:  100 mL Isovue 370 intravenous COMPARISON:  Chest x-ray 07/20/2016, CT thorax 04/12/2015 FINDINGS: Cardiovascular: There is no evidence for filling defect within the central or segmental pulmonary arteries to suggest acute embolus. Thoracic aorta demonstrates normal caliber and no evidence for dissection. Heart size is nonenlarged. No pericardial effusion. Mediastinum/Nodes: No significantly enlarged mediastinal lymph nodes. 9 mm node in the right hilus. No significant axillary adenopathy. Trachea and mainstem bronchi within normal limits.  Thyroid gland is unremarkable. Lungs/Pleura: No acute infiltrate, effusion, pneumothorax, or pulmonary nodule. Upper Abdomen: No acute abnormality. Musculoskeletal: No acute osseous abnormality Review of the MIP images confirms the above findings. IMPRESSION: Negative examination.  No CT evidence for acute pulmonary embolus. Electronically Signed   By: Donavan Foil M.D.   On: 07/20/2016 22:15    Review of Systems  Constitutional: Negative for chills and fever.  Respiratory: Positive for shortness of breath.   Cardiovascular: Positive for chest pain. Negative for palpitations, orthopnea and claudication.  Gastrointestinal: Negative for nausea and vomiting.  Genitourinary: Negative for dysuria.  Neurological: Negative for dizziness and headaches.   Blood pressure (!) 143/82, pulse 84, temperature 98.6 F (37 C), temperature source Oral, resp. rate 18, height 5' 11"  (1.803 m), weight 275 lb 11.2 oz (125.1 kg), SpO2 97 %. Physical Exam  Constitutional: He is oriented  to person, place, and time.  HENT:  Head: Normocephalic and atraumatic.  Eyes: Conjunctivae are normal. Pupils are equal, round, and reactive to light. Left eye exhibits no discharge. No scleral icterus.  Neck: Normal range of motion. Neck supple. No JVD present. No tracheal deviation present. No thyromegaly present.  Cardiovascular: Normal rate and regular rhythm.   Murmur (Soft systolic murmur noted no S3 gallop) heard. Respiratory: Effort normal and breath sounds normal. No respiratory distress. He has no wheezes. He has no rales.  GI: Soft. Bowel sounds are normal. He exhibits distension. There is no tenderness. There is no rebound.  Musculoskeletal: He exhibits no edema, tenderness or deformity.  Neurological: He is alert and oriented to person, place, and time.    Assessment/Plan: Recurrent chest pain MI ruled out Hypertension Diabetes mellitus Morbid obesity Depression Degenerative joint disease Plan Add low-dose.  A blockers as per orders Restart statin as per orders We'll schedule for nuclear stress test. Discussed with patient and his wife and agrees  Charolette Forward 07/21/2016, 10:12 AM

## 2016-07-21 NOTE — Progress Notes (Signed)
Inpatient Diabetes Program Recommendations  AACE/ADA: New Consensus Statement on Inpatient Glycemic Control (2015)  Target Ranges:  Prepandial:   less than 140 mg/dL      Peak postprandial:   less than 180 mg/dL (1-2 hours)      Critically ill patients:  140 - 180 mg/dL   Lab Results  Component Value Date   GLUCAP 254 (H) 07/21/2016   HGBA1C 10.9 (H) 12/12/2014    Review of Glycemic Control  Diabetes history: DM2 Outpatient Diabetes medications: glipizide 5 mg QAM, Synjardy 02/999 mg QD Current orders for Inpatient glycemic control: Levemir 12 units QAM, Novolog moderate tidwc and hs HgbA1C pending. Insulin adjustment needed.  Inpatient Diabetes Program Recommendations:    Increase Levemir to 20 units QAM Add Novolog 5 units tidwc for meal coverage insulin OP Diabetes Education consult for uncontrolled DM  Long discussion with pt and wife regarding his glycemic control. Pt states he has been drinking sometimes 3-4 2-liter sodas/day. Making poor choices with food and very little exercise. Has glucometer at home, but does not check blood sugars. States his PCP has discussed going on insulin with him.  Will likely need to go home on insulin. Will order insulin pen starter kit and begin teaching insulin administration in am, if pt will be going home on insulin.  Will continue to follow and will see pt in am.  Thank you. Lorenda Peck, RD, LDN, CDE Inpatient Diabetes Coordinator [phone]

## 2016-07-22 ENCOUNTER — Ambulatory Visit (HOSPITAL_COMMUNITY)
Admit: 2016-07-22 | Discharge: 2016-07-22 | Disposition: A | Discharge status: Home / Self Care | Payer: Medicare Other | Attending: Cardiology | Admitting: Cardiology

## 2016-07-22 DIAGNOSIS — I10 Essential (primary) hypertension: Secondary | ICD-10-CM

## 2016-07-22 DIAGNOSIS — R079 Chest pain, unspecified: Secondary | ICD-10-CM | POA: Insufficient documentation

## 2016-07-22 DIAGNOSIS — E1165 Type 2 diabetes mellitus with hyperglycemia: Secondary | ICD-10-CM

## 2016-07-22 DIAGNOSIS — R0789 Other chest pain: Secondary | ICD-10-CM | POA: Diagnosis not present

## 2016-07-22 LAB — GLUCOSE, CAPILLARY
GLUCOSE-CAPILLARY: 293 mg/dL — AB (ref 65–99)
GLUCOSE-CAPILLARY: 239 mg/dL — AB (ref 65–99)
Glucose-Capillary: 282 mg/dL — ABNORMAL HIGH (ref 65–99)

## 2016-07-22 LAB — HEMOGLOBIN A1C
Hgb A1c MFr Bld: 10.5 % — ABNORMAL HIGH (ref 4.8–5.6)
Mean Plasma Glucose: 255 mg/dL

## 2016-07-22 MED ORDER — ATORVASTATIN CALCIUM 20 MG PO TABS: 20.0000 mg | ORAL_TABLET | Freq: Every day | ORAL | 3 refills | Status: AC

## 2016-07-22 MED ORDER — BLOOD GLUCOSE METER KIT: PACK | 0 refills | Status: AC

## 2016-07-22 MED ORDER — REGADENOSON 0.4 MG/5ML IV SOLN
INTRAVENOUS | Status: AC
  Filled 2016-07-22: qty 5

## 2016-07-22 MED ORDER — TECHNETIUM TC 99M TETROFOSMIN IV KIT: 10.0000 | PACK | Freq: Once | INTRAVENOUS | Status: DC | PRN

## 2016-07-22 MED ORDER — TECHNETIUM TC 99M TETROFOSMIN IV KIT
30.0000 | PACK | Freq: Once | INTRAVENOUS | Status: AC | PRN
  Administered 2016-07-22: 30 via INTRAVENOUS

## 2016-07-22 MED ORDER — GLIPIZIDE 5 MG PO TABS
10.0000 mg | ORAL_TABLET | Freq: Every morning | ORAL | 2 refills | Status: AC
Start: 2016-07-22 — End: ?

## 2016-07-22 MED ORDER — REGADENOSON 0.4 MG/5ML IV SOLN
0.4000 mg | Freq: Once | INTRAVENOUS | Status: AC
  Administered 2016-07-22: 0.4 mg via INTRAVENOUS
  Filled 2016-07-22: qty 5

## 2016-07-22 MED ORDER — TECHNETIUM TC 99M TETROFOSMIN IV KIT
10.0000 | PACK | Freq: Once | INTRAVENOUS | Status: AC | PRN
  Administered 2016-07-22: 10 via INTRAVENOUS

## 2016-07-22 NOTE — Progress Notes (Signed)
Pt and Wife given discharge instructions and Medication teaching. Pt and Wife verbalized understanding of all discharge instructions and medications. VSS No acute changes noted in Pt's assessment  at time of discharge

## 2016-07-22 NOTE — Plan of Care (Signed)
Problem: Food- and Nutrition-Related Knowledge Deficit (NB-1.1) Goal: Nutrition education Formal process to instruct or train a patient/client in a skill or to impart knowledge to help patients/clients voluntarily manage or modify food choices and eating behavior to maintain or improve health. Outcome: Completed/Met Date Met: 07/22/16  RD consulted for nutrition education regarding diabetes.   Lab Results  Component Value Date   HGBA1C 10.5 (H) 07/21/2016    RD provided "Carbohydrate Counting for People with Diabetes" handout from the Academy of [employer] and Dietetics. Discussed different food groups and their effects on blood sugar, emphasizing carbohydrate-containing foods. Provided list of carbohydrates and recommended serving sizes of common foods.  Discussed importance of controlled and consistent carbohydrate intake throughout the day. Provided examples of ways to balance meals/snacks and encouraged intake of high-fiber, whole grain complex carbohydrates. Teach back method used.  Expect good compliance with family support.  Body mass index is 38.45 kg/m. Pt meets criteria for obesity based on current BMI.  Current diet order is CHO modified, patient is consuming approximately 100% of meals at this time. Labs and medications reviewed. No further nutrition interventions warranted at this time.  If additional nutrition issues arise, please re-consult RD.   , MS, RD, LDN Pager: [phone] After Hours Pager: [phone]    

## 2016-07-22 NOTE — Discharge Summary (Addendum)
Physician Discharge Summary  Steve Pierce UJW:[id number] DOB: 05-25-1971 DOA: 07/20/2016  PCP: [facility]  Admit date: 07/20/2016 Discharge date: 07/22/2016  Admitted From: home Disposition:  Home  Recommendations for Outpatient Follow-up:  1. Follow up with PCP in 1-2 weeks 2. Please obtain BMP/CBC in one week 3. Follow-up with cardiology in 2-4 weeks. 4. Need to stress with him the importance of taking his medication and second checking his CBGs before meals and at bedtime  Home Health:No Equipment/Devices:None  Discharge Condition:stable CODE STATUS:full Diet recommendation: Heart Healthy / Carb Modified  Brief/Interim Summary: 45 year old with past medical history of hypertension, diabetes mellitus, morbid obesity that comes in for chest pain associated with shortness of breath that lasted 5-10 minutes substernal 24 hours prior to admission.  Discharge Diagnoses:  Chest pain atypical: heart score 4-5 , red enzymes were cycled which were negative 3 EKG showed no acute ischemic changes. He was continue on aspirin and beta blocker and a sense that her, he was also started on a statin. Cardiology was consulted recommended a stress test which was negative. The echo was done that showed an EF of 50% normal motion abnormalities chest x-ray did not show any acute infiltrates, CT injury of the chest did not show any PE. He was started on Protonix twice a day as he is taking several NSAIDs which can be the cause of his chest pain, he had no alarming symptoms.  Uncontrolled diabetes mellitus: Hemoglobin A1c was done and it was 10.5, unlikely that he is taking all of his oral medications as an outpatient. Had long discussion with him and he seems to understand the importance of taking his medications. No changes were made he will follow-up with his primary care doctor as an outpatient which will titrate medications as needed.  Essential hypertension: No changes were made to  his medication.  Depression: Seems to be stable no changes were made.  Morbid obesity with a body mass index of 38, he was counseled.   GERD: Next line continue PPI.  Hyperlipidemia: Goal LDL is less than 70 continue statins and outpatient will need a CT that check in 6 weeks.  Discharge Instructions  Discharge Instructions    Ambulatory referral to Nutrition and Diabetic Education    Complete by:  As directed    Maxed out on OHAs. Will likely go home on insulin. Needs refresher course.   Diet - low sodium heart healthy    Complete by:  As directed    Increase activity slowly    Complete by:  As directed        Medication List    STOP taking these medications   diclofenac 75 MG EC tablet Commonly known as:  VOLTAREN   meloxicam 7.5 MG tablet Commonly known as:  MOBIC   naproxen 500 MG tablet Commonly known as:  NAPROSYN     TAKE these medications   ABILIFY MAINTENA 400 MG Susr Generic drug:  ARIPiprazole ER Inject 1 each into the skin every 28 (twenty-eight) days.   aspirin 325 MG tablet Take 325 mg by mouth daily.   atorvastatin 20 MG tablet Commonly known as:  LIPITOR Take 1 tablet (20 mg total) by mouth daily.   furosemide 40 MG tablet Commonly known as:  LASIX Take 40 mg by mouth daily.   gabapentin 400 MG capsule Commonly known as:  NEURONTIN Take 1 capsule (400 mg total) by mouth 4 (four) times daily. What changed:  when to take this  glipiZIDE 5 MG tablet Commonly known as:  GLUCOTROL Take 2 tablets (10 mg total) by mouth every morning. What changed:  how much to take   HYDROcodone-acetaminophen 5-325 MG tablet Commonly known as:  NORCO/VICODIN Take 1 tablet by mouth every 4 (four) hours as needed.   LATUDA 40 MG Tabs tablet Generic drug:  lurasidone Take 40 mg by mouth at bedtime.   lisinopril 20 MG tablet Commonly known as:  PRINIVIL,ZESTRIL Take 1 tablet (20 mg total) by mouth daily.   lithium carbonate 300 MG capsule Take 300 mg  by mouth 2 (two) times daily with a meal.   metFORMIN 1000 MG tablet Commonly known as:  GLUCOPHAGE Take 1 tablet (1,000 mg total) by mouth 2 (two) times daily with a meal.   methocarbamol 750 MG tablet Commonly known as:  ROBAXIN take 1 tablet by mouth twice a day if needed muscle spasm   oxyCODONE-acetaminophen 10-325 MG tablet Commonly known as:  PERCOCET Take 1 tablet by mouth at bedtime as needed for pain.   sertraline 50 MG tablet Commonly known as:  ZOLOFT Take 50 mg by mouth daily.   sitaGLIPtin 100 MG tablet Commonly known as:  JANUVIA Take 1 tablet (100 mg total) by mouth daily.   SYNJARDY 02-999 MG Tabs Generic drug:  Empagliflozin-Metformin HCl Take 1 tablet by mouth daily.   testosterone cypionate 200 MG/ML injection Commonly known as:  DEPOTESTOSTERONE CYPIONATE Inject 1 mL into the muscle every 7 (seven) days.   topiramate 100 MG tablet Commonly known as:  TOPAMAX Take 100 mg by mouth 2 (two) times daily as needed (headaches).   traZODone 50 MG tablet Commonly known as:  DESYREL Take 1 tablet by mouth daily as needed for sleep.      Follow-up Information    Rinaldo Cloud, MD Follow up in 3 week(s).   Specialty:  Cardiology Why:  hospital follow up[ Contact information: [address] [place] [address] [phone]          No Known Allergies  Consultations: Dr. Levie Heritage cardiology Procedures/Studies: Dg Chest 2 View  Result Date: 07/20/2016 CLINICAL DATA:  Shortness of breath and chest discomfort for 1 day. EXAM: CHEST  2 VIEW COMPARISON:  CT chest and PA and lateral chest 04/12/2015. FINDINGS: Lungs are clear. Heart size is normal. No pneumothorax or pleural effusion. No bony abnormality. IMPRESSION: Negative chest. Electronically Signed   By: Drusilla Kanner M.D.   On: 07/20/2016 20:04   Ct Angio Chest Pe W And/or Wo Contrast  Result Date: 07/20/2016 CLINICAL DATA:  Shortness of breath and intermittent chest pain  EXAM: CT ANGIOGRAPHY CHEST WITH CONTRAST TECHNIQUE: Multidetector CT imaging of the chest was performed using the standard protocol during bolus administration of intravenous contrast. Multiplanar CT image reconstructions and MIPs were obtained to evaluate the vascular anatomy. CONTRAST:  100 mL Isovue 370 intravenous COMPARISON:  Chest x-ray 07/20/2016, CT thorax 04/12/2015 FINDINGS: Cardiovascular: There is no evidence for filling defect within the central or segmental pulmonary arteries to suggest acute embolus. Thoracic aorta demonstrates normal caliber and no evidence for dissection. Heart size is nonenlarged. No pericardial effusion. Mediastinum/Nodes: No significantly enlarged mediastinal lymph nodes. 9 mm node in the right hilus. No significant axillary adenopathy. Trachea and mainstem bronchi within normal limits. Thyroid gland is unremarkable. Lungs/Pleura: No acute infiltrate, effusion, pneumothorax, or pulmonary nodule. Upper Abdomen: No acute abnormality. Musculoskeletal: No acute osseous abnormality Review of the MIP images confirms the above findings. IMPRESSION: Negative examination.  No CT evidence for acute pulmonary embolus. Electronically Signed   By: Jasmine Pang M.D.   On: 07/20/2016 22:15   Nm Myocar Multi W/spect W/wall Motion / Ef  Result Date: 07/22/2016 CLINICAL DATA:  Chest pain. Diabetes. Hypertension. Shortness of Breath. EXAM: MYOCARDIAL IMAGING WITH SPECT (REST AND PHARMACOLOGIC-STRESS) GATED LEFT VENTRICULAR WALL MOTION STUDY LEFT VENTRICULAR EJECTION FRACTION TECHNIQUE: Standard myocardial SPECT imaging was performed after resting intravenous injection of 10 mCi Tc-32m tetrofosmin. Subsequently, intravenous infusion of Lexiscan was performed under the supervision of the Cardiology staff. At peak effect of the drug, 30 mCi Tc-64m tetrofosmin was injected intravenously and standard myocardial SPECT imaging was performed. Quantitative gated imaging was also performed to evaluate  left ventricular wall motion, and estimate left ventricular ejection fraction. COMPARISON:  Chest CT dated 07/20/2016 FINDINGS: Perfusion: Reduced inferior wall activity on stress and rest imaging, probably from diaphragmatic attenuation, less likely from scarring. Wall Motion: Normal left ventricular wall motion. No left ventricular dilation. Left Ventricular Ejection Fraction: 59 % End diastolic volume 118 ml End systolic volume 49 ml IMPRESSION: 1. Reduced inferior wall activity on stress and rest images, favoring diaphragmatic attenuation, less likely from mild scarring. 2. Normal left ventricular wall motion. 3. Left ventricular ejection fraction 59% 4. Non invasive risk stratification*: Low *2012 Appropriate Use Criteria for Coronary Revascularization Focused Update: J Am Coll Cardiol. 2012;59(9):857-881. http://[email or web address] Electronically Signed   By: Gaylyn Rong M.D.   On: 07/22/2016 15:23   2-D echo with results above. Subjective: Complaints feels great.  Discharge Exam: Vitals:   07/22/16 1103 07/22/16 1322  BP: (!) 141/70 117/77  Pulse: 95 90  Resp:  18  Temp:  98.2 F (36.8 C)   Vitals:   07/22/16 1059 07/22/16 1101 07/22/16 1103 07/22/16 1322  BP: (!) 139/58 (!) 142/66 (!) 141/70 117/77  Pulse: (!) 123 (!) 111 95 90  Resp:    18  Temp:    98.2 F (36.8 C)  TempSrc:    Oral  SpO2:    98%  Weight:      Height:        General: Pt is alert, awake, not in acute distress Cardiovascular: RRR, S1/S2 +, no rubs, no gallops Respiratory: CTA bilaterally, no wheezing, no rhonchi Abdominal: Soft, NT, ND, bowel sounds + Extremities: no edema, no cyanosis    The results of significant diagnostics from this hospitalization (including imaging, microbiology, ancillary and laboratory) are listed below for reference.     Microbiology: No results found for this or any previous visit (from the past 240 hour(s)).   Labs: BNP (last 3  results) No results for input(s): BNP in the last 8760 hours. Basic Metabolic Panel:  Recent Labs Lab 07/20/16 1933 07/21/16 0125  NA 132* 134*  K 4.6 3.8  CL 100* 103  CO2 22 22  GLUCOSE 370* 279*  BUN 22* 18  CREATININE 1.03 0.88  CALCIUM 9.4 9.0  MG  --  1.8  PHOS  --  3.3   Liver Function Tests:  Recent Labs Lab 07/21/16 0125  AST 30  ALT 31  ALKPHOS 121  BILITOT 0.7  PROT 7.5  ALBUMIN 3.9   No results for input(s): LIPASE, AMYLASE in the last 168 hours. No results for input(s): AMMONIA in the last 168 hours. CBC:  Recent Labs Lab 07/20/16 1933 07/21/16 0125  WBC 7.3 6.7  HGB 14.0 13.0  HCT 41.4 40.2  MCV 57.3* 58.9*  PLT 287 265   Cardiac Enzymes:  Recent  Labs Lab 07/21/16 0125 07/21/16 0616 07/21/16 1217  TROPONINI <0.03 <0.03 <0.03   BNP: Invalid input(s): POCBNP CBG:  Recent Labs Lab 07/21/16 1646 07/21/16 2053 07/22/16 0750 07/22/16 1338 07/22/16 1613  GLUCAP 209* 199* 282* 293* 239*   D-Dimer No results for input(s): DDIMER in the last 72 hours. Hgb A1c  Recent Labs  07/21/16 0125  HGBA1C 10.5*   Lipid Profile  Recent Labs  07/21/16 0616  CHOL 173  HDL 41  LDLCALC 101*  TRIG 153*  CHOLHDL 4.2   Thyroid function studies  Recent Labs  07/21/16 0125  TSH 0.995   Anemia work up No results for input(s): VITAMINB12, FOLATE, FERRITIN, TIBC, IRON, RETICCTPCT in the last 72 hours. Urinalysis    Component Value Date/Time   COLORURINE YELLOW 12/12/2014 1551   APPEARANCEUR TURBID (A) 12/12/2014 1551   LABSPEC >1.030 (H) 12/12/2014 1551   PHURINE 5.5 12/12/2014 1551   GLUCOSEU > 1000 (A) 12/12/2014 1551   HGBUR NEG 12/12/2014 1551   BILIRUBINUR NEG 12/12/2014 1551   KETONESUR TRACE (A) 12/12/2014 1551   PROTEINUR NEG 12/12/2014 1551   UROBILINOGEN 0.2 12/12/2014 1551   NITRITE NEG 12/12/2014 1551   LEUKOCYTESUR NEG 12/12/2014 1551   Sepsis Labs Invalid input(s): PROCALCITONIN,  WBC,   LACTICIDVEN Microbiology No results found for this or any previous visit (from the past 240 hour(s)).   Time coordinating discharge: Over 30 minutes  SIGNED:   Marinda ElkFELIZ ORTIZ, Brailyn Delman, MD  [facility] 07/22/2016, 5:19 PM Pager   If 7PM-7AM, please contact night-coverage [email or web address] Password [email or web address]

## 2016-07-22 NOTE — Progress Notes (Signed)
Inpatient Diabetes Program Recommendations  AACE/ADA: New Consensus Statement on Inpatient Glycemic Control (2015)  Target Ranges:  Prepandial:   less than 140 mg/dL      Peak postprandial:   less than 180 mg/dL (1-2 hours)      Critically ill patients:  140 - 180 mg/dL   Lab Results  Component Value Date   GLUCAP 239 (H) 07/22/2016   HGBA1C 10.5 (H) 07/21/2016    Review of Glycemic Control  Educated patient and spouse on insulin pen use at home. Reviewed contents of insulin flexpen starter kit. Reviewed all steps if insulin pen including attachment of needle, 2-unit air shot, dialing up dose, giving injection, removing needle, disposal of sharps, storage of unused insulin, disposal of insulin etc. Patient able to provide successful return demonstration. Also reviewed troubleshooting with insulin pen. MD to give patient Rxs for insulin pens and insulin pen needles.  Inpatient Diabetes Program Recommendations:    Lantus 20 units QAM. Solostar pen for home use.  Insulin pen needle prescription. Will need prescription for meter and strips.  Pt and wife think pt should go home on insulin. States "I understand why I need it. My doctor has told me I need it." Instructed on hypoglycemia s/s and treatment. Pt voiced understanding.  Thank you. Lorenda Peck, RD, LDN, CDE Inpatient Diabetes Coordinator [phone]

## 2016-07-22 NOTE — Progress Notes (Signed)
Subjective:  Denies any further chest pain or shortness of breath. Seen in nuclear medicine department tolerated [facility] stress [facility]  Objective:  Vital Signs in the last 24 hours: Temp:  [98.3 F (36.8 C)-99 F (37.2 C)] 98.7 F (37.1 C) (10/11 0810) Pulse Rate:  [75-123] 95 (10/11 1103) Resp:  [18-20] 18 (10/11 1057) BP: (115-142)/(58-89) 141/70 (10/11 1103) SpO2:  [95 %-100 %] 98 % (10/11 0810)  Intake/Output from previous day: 10/10 0701 - 10/11 0700 In: 240 [P.O.:240] Out: 1075 [Urine:1075] Intake/Output from this shift: No intake/output data recorded.  Physical Exam: Neck: no adenopathy, no carotid bruit, no JVD and supple, symmetrical, trachea midline Lungs: clear to auscultation bilaterally Heart: regular rate and rhythm, S1, S2 normal and Soft systolic murmur noted no S3 gallop Abdomen: soft, non-tender; bowel sounds normal; no masses,  no organomegaly Extremities: extremities normal, atraumatic, no cyanosis or edema  Lab Results:  Recent Labs  07/20/16 1933 07/21/16 0125  WBC 7.3 6.7  HGB 14.0 13.0  PLT 287 265    Recent Labs  07/20/16 1933 07/21/16 0125  NA 132* 134*  K 4.6 3.8  CL 100* 103  CO2 22 22  GLUCOSE 370* 279*  BUN 22* 18  CREATININE 1.03 0.88    Recent Labs  07/21/16 0616 07/21/16 1217  TROPONINI <0.03 <0.03   Hepatic Function Panel  Recent Labs  07/21/16 0125  PROT 7.5  ALBUMIN 3.9  AST 30  ALT 31  ALKPHOS 121  BILITOT 0.7    Recent Labs  07/21/16 0616  CHOL 173   No results for input(s): PROTIME in the last 72 hours.  Imaging: Imaging results have been reviewed and Dg Chest 2 View  Result Date: 07/20/2016 CLINICAL DATA:  Shortness of breath and chest discomfort for 1 day. EXAM: CHEST  2 VIEW COMPARISON:  CT chest and PA and lateral chest 04/12/2015. FINDINGS: Lungs are clear. Heart size is normal. No pneumothorax or pleural effusion. No bony abnormality. IMPRESSION: Negative chest.  Electronically Signed   By: Drusilla Kannerhomas  Dalessio M.D.   On: 07/20/2016 20:04   Ct Angio Chest Pe W And/or Wo Contrast  Result Date: 07/20/2016 CLINICAL DATA:  Shortness of breath and intermittent chest pain EXAM: CT ANGIOGRAPHY CHEST WITH CONTRAST TECHNIQUE: Multidetector CT imaging of the chest was performed using the standard protocol during bolus administration of intravenous contrast. Multiplanar CT image reconstructions and MIPs were obtained to evaluate the vascular anatomy. CONTRAST:  100 mL Isovue 370 intravenous COMPARISON:  Chest x-ray 07/20/2016, CT thorax 04/12/2015 FINDINGS: Cardiovascular: There is no evidence for filling defect within the central or segmental pulmonary arteries to suggest acute embolus. Thoracic aorta demonstrates normal caliber and no evidence for dissection. Heart size is nonenlarged. No pericardial effusion. Mediastinum/Nodes: No significantly enlarged mediastinal lymph nodes. 9 mm node in the right hilus. No significant axillary adenopathy. Trachea and mainstem bronchi within normal limits. Thyroid gland is unremarkable. Lungs/Pleura: No acute infiltrate, effusion, pneumothorax, or pulmonary nodule. Upper Abdomen: No acute abnormality. Musculoskeletal: No acute osseous abnormality Review of the MIP images confirms the above findings. IMPRESSION: Negative examination.  No CT evidence for acute pulmonary embolus. Electronically Signed   By: Jasmine PangKim  Fujinaga M.D.   On: 07/20/2016 22:15    Cardiac Studies:  Assessment/Plan:  Status post Recurrent chest pain MI ruled out Hypertension Diabetes mellitus Morbid obesity Depression Degenerative joint disease Plan Continue present management Check nuclear scan results  LOS: 0 days    Rinaldo CloudHarwani, Reyansh Kushnir 07/22/2016, 11:58  AM
